# Patient Record
Sex: Male | Born: 1965 | Hispanic: No | Marital: Married | Smoking: Never smoker
Health system: Southern US, Community
[De-identification: ages and names within clinical notes are randomized; demographics above are authoritative.]

## PROBLEM LIST (undated history)

## (undated) DIAGNOSIS — R030 Elevated blood-pressure reading, without diagnosis of hypertension: Secondary | ICD-10-CM

## (undated) DIAGNOSIS — E669 Obesity, unspecified: Secondary | ICD-10-CM

## (undated) DIAGNOSIS — Z9989 Dependence on other enabling machines and devices: Secondary | ICD-10-CM

## (undated) DIAGNOSIS — Z8659 Personal history of other mental and behavioral disorders: Secondary | ICD-10-CM

## (undated) DIAGNOSIS — G4733 Obstructive sleep apnea (adult) (pediatric): Secondary | ICD-10-CM

## (undated) DIAGNOSIS — Z9109 Other allergy status, other than to drugs and biological substances: Secondary | ICD-10-CM

## (undated) DIAGNOSIS — R7303 Prediabetes: Secondary | ICD-10-CM

## (undated) DIAGNOSIS — L209 Atopic dermatitis, unspecified: Secondary | ICD-10-CM

## (undated) DIAGNOSIS — Z8639 Personal history of other endocrine, nutritional and metabolic disease: Secondary | ICD-10-CM

## (undated) DIAGNOSIS — K219 Gastro-esophageal reflux disease without esophagitis: Secondary | ICD-10-CM

## (undated) DIAGNOSIS — D126 Benign neoplasm of colon, unspecified: Secondary | ICD-10-CM

## (undated) DIAGNOSIS — G4730 Sleep apnea, unspecified: Secondary | ICD-10-CM

## (undated) DIAGNOSIS — A630 Anogenital (venereal) warts: Secondary | ICD-10-CM

## (undated) HISTORY — DX: Obesity, unspecified: E66.9

## (undated) HISTORY — DX: Dependence on other enabling machines and devices: Z99.89

## (undated) HISTORY — DX: Gastro-esophageal reflux disease without esophagitis: K21.9

## (undated) HISTORY — DX: Sleep apnea, unspecified: G47.30

## (undated) HISTORY — DX: Other allergy status, other than to drugs and biological substances: Z91.09

## (undated) HISTORY — DX: Personal history of other endocrine, nutritional and metabolic disease: Z86.39

## (undated) HISTORY — DX: Personal history of other mental and behavioral disorders: Z86.59

## (undated) HISTORY — DX: Obstructive sleep apnea (adult) (pediatric): G47.33

## (undated) HISTORY — PX: POLYPECTOMY: SHX149

## (undated) HISTORY — PX: COLONOSCOPY: SHX174

## (undated) HISTORY — DX: Anogenital (venereal) warts: A63.0

## (undated) HISTORY — DX: Prediabetes: R73.03

## (undated) HISTORY — DX: Elevated blood-pressure reading, without diagnosis of hypertension: R03.0

## (undated) HISTORY — DX: Benign neoplasm of colon, unspecified: D12.6

## (undated) HISTORY — DX: Atopic dermatitis, unspecified: L20.9

---
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
## 2008-01-04 ENCOUNTER — Other Ambulatory Visit (HOSPITAL_COMMUNITY): Admission: RE | Admit: 2008-01-04 | Discharge: 2008-02-01 | Payer: Self-pay | Admitting: Psychiatry

## 2008-01-06 ENCOUNTER — Ambulatory Visit: Payer: Self-pay | Admitting: Psychiatry

## 2009-04-07 DIAGNOSIS — Z8639 Personal history of other endocrine, nutritional and metabolic disease: Secondary | ICD-10-CM

## 2009-04-07 HISTORY — DX: Personal history of other endocrine, nutritional and metabolic disease: Z86.39

## 2011-01-07 LAB — URINE DRUGS OF ABUSE SCREEN W ALC, ROUTINE (REF LAB)
Amphetamine Screen, Ur: NEGATIVE
Cocaine Metabolites: NEGATIVE
Creatinine,U: 15.4
Methadone: NEGATIVE
Opiate Screen, Urine: NEGATIVE

## 2011-06-17 ENCOUNTER — Ambulatory Visit (INDEPENDENT_AMBULATORY_CARE_PROVIDER_SITE_OTHER): Payer: Managed Care, Other (non HMO) | Admitting: Family Medicine

## 2011-06-17 ENCOUNTER — Encounter: Payer: Self-pay | Admitting: Family Medicine

## 2011-06-17 VITALS — BP 116/82 | HR 77 | Temp 97.0°F | Ht 74.25 in | Wt 252.0 lb

## 2011-06-17 DIAGNOSIS — Z23 Encounter for immunization: Secondary | ICD-10-CM

## 2011-06-17 DIAGNOSIS — Z0000 Encounter for general adult medical examination without abnormal findings: Secondary | ICD-10-CM

## 2011-06-17 LAB — CBC WITH DIFFERENTIAL/PLATELET
Basophils Relative: 0.8 % (ref 0.0–3.0)
Eosinophils Relative: 5.3 % — ABNORMAL HIGH (ref 0.0–5.0)
Lymphocytes Relative: 30.3 % (ref 12.0–46.0)
MCV: 88.6 fl (ref 78.0–100.0)
Monocytes Relative: 10 % (ref 3.0–12.0)
Neutrophils Relative %: 53.6 % (ref 43.0–77.0)
RBC: 4.98 Mil/uL (ref 4.22–5.81)
WBC: 7.8 10*3/uL (ref 4.5–10.5)

## 2011-06-17 LAB — LIPID PANEL
Cholesterol: 203 mg/dL — ABNORMAL HIGH (ref 0–200)
HDL: 42.1 mg/dL (ref >39.00)
Total CHOL/HDL Ratio: 5
Triglycerides: 288 mg/dL — ABNORMAL HIGH (ref 0.0–149.0)
VLDL: 57.6 mg/dL — ABNORMAL HIGH (ref 0.0–40.0)

## 2011-06-17 LAB — COMPREHENSIVE METABOLIC PANEL
ALT: 27 U/L (ref 0–53)
AST: 19 U/L (ref 0–37)
Creatinine, Ser: 1.1 mg/dL (ref 0.4–1.5)
GFR: 75.49 mL/min (ref >60.00)
Total Bilirubin: 0.6 mg/dL (ref 0.3–1.2)

## 2011-06-17 NOTE — Patient Instructions (Signed)
Health Maintenance, Males A healthy lifestyle and preventative care can promote health and wellness.  Maintain regular health, dental, and eye exams.   Eat a healthy diet. Foods like vegetables, fruits, whole grains, low-fat dairy products, and lean protein foods contain the nutrients you need without too many calories. Decrease your intake of foods high in solid fats, added sugars, and salt. Get information about a proper diet from your caregiver, if necessary.   Regular physical exercise is one of the most important things you can do for your health. Most adults should get at least 150 minutes of moderate-intensity exercise (any activity that increases your heart rate and causes you to sweat) each week. In addition, most adults need muscle-strengthening exercises on 2 or more days a week.    Maintain a healthy weight. The body mass index (BMI) is a screening tool to identify possible weight problems. It provides an estimate of body fat based on height and weight. Your caregiver can help determine your BMI, and can help you achieve or maintain a healthy weight. For adults 20 years and older:   A BMI below 18.5 is considered underweight.   A BMI of 18.5 to 24.9 is normal.   A BMI of 25 to 29.9 is considered overweight.   A BMI of 30 and above is considered obese.   Maintain normal blood lipids and cholesterol by exercising and minimizing your intake of saturated fat. Eat a balanced diet with plenty of fruits and vegetables. Blood tests for lipids and cholesterol should begin at age 20 and be repeated every 5 years. If your lipid or cholesterol levels are high, you are over 50, or you are a high risk for heart disease, you may need your cholesterol levels checked more frequently.Ongoing high lipid and cholesterol levels should be treated with medicines, if diet and exercise are not effective.   If you smoke, find out from your caregiver how to quit. If you do not use tobacco, do not start.    If you choose to drink alcohol, do not exceed 2 drinks per day. One drink is considered to be 12 ounces (355 mL) of beer, 5 ounces (148 mL) of wine, or 1.5 ounces (44 mL) of liquor.   Avoid use of street drugs. Do not share needles with anyone. Ask for help if you need support or instructions about stopping the use of drugs.   High blood pressure causes heart disease and increases the risk of stroke. Blood pressure should be checked at least every 1 to 2 years. Ongoing high blood pressure should be treated with medicines if weight loss and exercise are not effective.   If you are 45 to 47 years old, ask your caregiver if you should take aspirin to prevent heart disease.   Diabetes screening involves taking a blood sample to check your fasting blood sugar level. This should be done once every 3 years, after age 45, if you are within normal weight and without risk factors for diabetes. Testing should be considered at a younger age or be carried out more frequently if you are overweight and have at least 1 risk factor for diabetes.   Colorectal cancer can be detected and often prevented. Most routine colorectal cancer screening begins at the age of 50 and continues through age 75. However, your caregiver may recommend screening at an earlier age if you have risk factors for colon cancer. On a yearly basis, your caregiver may provide home test kits to check for hidden   blood in the stool. Use of a small camera at the end of a tube, to directly examine the colon (sigmoidoscopy or colonoscopy), can detect the earliest forms of colorectal cancer. Talk to your caregiver about this at age 50, when routine screening begins. Direct examination of the colon should be repeated every 5 to 10 years through age 75, unless early forms of pre-cancerous polyps or small growths are found.   Hepatitis C blood testing is recommended for all people born from 1945 through 1965 and any individual with known risks for  hepatitis C.   Healthy men should no longer receive prostate-specific antigen (PSA) blood tests as part of routine cancer screening. Consult with your caregiver about prostate cancer screening.   Testicular cancer screening is not recommended for adolescents or adult males who have no symptoms. Screening includes self-exam, caregiver exam, and other screening tests. Consult with your caregiver about any symptoms you have or any concerns you have about testicular cancer.   Practice safe sex. Use condoms and avoid high-risk sexual practices to reduce the spread of sexually transmitted infections (STIs).   Use sunscreen with a sun protection factor (SPF) of 30 or greater. Apply sunscreen liberally and repeatedly throughout the day. You should seek shade when your shadow is shorter than you. Protect yourself by wearing long sleeves, pants, a wide-brimmed hat, and sunglasses year round, whenever you are outdoors.   Notify your caregiver of new moles or changes in moles, especially if there is a change in shape or color. Also notify your caregiver if a mole is larger than the size of a pencil eraser.   A one-time screening for abdominal aortic aneurysm (AAA) and surgical repair of large AAAs by sound wave imaging (ultrasonography) is recommended for ages 65 to 75 years who are current or former smokers.   Stay current with your immunizations.  Document Released: 09/20/2007 Document Revised: 03/13/2011 Document Reviewed: 08/19/2010 [employer] Patient Information 2012 [employer]. 

## 2011-06-17 NOTE — Assessment & Plan Note (Signed)
Reviewed age and gender appropriate health maintenance issues (prudent diet, regular exercise, health risks of tobacco and excessive alcohol, use of seatbelts, fire alarms in home, use of sunscreen).  Also reviewed age and gender appropriate health screening as well as vaccine recommendations. Tdap and flu vaccines today. Fasting CBC, CMET, TSH, and lipid panel today. Discussed getting started with some lifestyle modification for wt loss and CV health.

## 2011-06-17 NOTE — Progress Notes (Signed)
Office Note 06/18/2011  CC:  Chief Complaint  Patient presents with  . Establish Care    would like CPE    HPI:  William Guerrero is a 46 y.o. White male who is here to establish care and get CPE. Patient's most recent primary MD: ? Male MD in [place], he can't recall name (pt states no pertinent records there), last visit was about 2 yrs ago. Old records were not reviewed prior to or during today's visit.  No acute complaints.  Wife wanted him to get CPE with labs b/c it has been a few years since this has been done.  Past Medical History  Diagnosis Date  . OSA on CPAP dx'd 2013    Dr. Jerre Simon in [place]   . Elevated blood pressure reading without diagnosis of hypertension   . History of elevated glucose 2011  . 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 History of depression     situational; on meds briefly but stopped them and it spontaneously resolved.    Past Surgical History  Procedure Date  . Femur surgery 1984    Plate inserted for femur fx sustained playing football in HS; plate was later removed.  No residual problems.    Family History  Problem Relation Age of Onset  . Diabetes Father   . Cancer Maternal Grandfather     lung  . Heart disease Paternal Grandfather     sudden death age 60  . Hyperlipidemia Paternal Grandfather   . Hypertension Paternal Grandfather     History   Social History  . Marital Status: Married    Spouse Name: N/A    Number of Children: 2  . Years of Education: N/A   Occupational History  . Not on file.   Social History Main Topics  . Smoking status: Never Smoker   . Smokeless tobacco: Current User    Types: Chew   Comment: dip tobacco ocassionally, not daily  . Alcohol Use: Yes     rare  . Drug Use: No  . Sexually Active: Not on file   Other Topics Concern  . 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    Outpatient Encounter Prescriptions as of 06/17/2011  Medication Sig Dispense Refill  . Multiple Vitamin (MULTIVITAMIN) tablet Take 1 tablet by mouth daily.        No Known Allergies  ROS Review of Systems  Constitutional: Negative for fever, chills, appetite change and fatigue.  HENT: Negative for ear pain, congestion, sore throat, neck stiffness and dental problem.   Eyes: Negative for discharge, redness and visual disturbance.  Respiratory: Negative for cough, chest tightness, shortness of breath and wheezing.   Cardiovascular: Negative for chest pain, palpitations and leg swelling.  Gastrointestinal: Negative for nausea, vomiting, abdominal pain, diarrhea and blood in stool.  Genitourinary: Negative for dysuria, urgency, frequency, hematuria, flank pain and difficulty urinating.  Musculoskeletal: Negative for myalgias, back pain, joint swelling and arthralgias.  Skin: Negative for pallor and rash.  Neurological: Negative for dizziness, speech difficulty, weakness and headaches.  Hematological: Negative for adenopathy. Does not bruise/bleed easily.  Psychiatric/Behavioral: Negative for confusion and sleep disturbance. The patient is not nervous/anxious.     PE; Blood pressure 116/82, pulse 77, temperature 97 F (36.1 C), temperature source Temporal, height 6' 2.25" (1.886 m), weight  252 lb (114.306 kg), SpO2 98.00%. Gen: Alert, well appearing.  Patient is oriented to person, place, time, and situation. Psych: pleasant affect, lucid thought and speech. ENT: Ears: EACs clear, normal epithelium.  TMs with good light reflex and landmarks bilaterally.  Eyes: no injection, icteris, swelling, or exudate.  EOMI, PERRLA. Nose: no drainage or turbinate edema/swelling.  No injection or focal lesion.  Mouth: lips without lesion/swelling.  Oral mucosa pink and moist.  Dentition intact and without obvious caries or  gingival swelling.  Oropharynx without erythema, exudate, or swelling.  Neck - No masses or thyromegaly or limitation in range of motion RRR, without murmur, rub, or gallop. Carotid pulses easily palpable, symmetric pulsations, no bruit or palpable dilatation. Radial, femoral, and ankle pulses easily palpable and symmetric. No abdominal bruit. No varicosities.  Cap refill in extremities is brisk. Chest with symmetric expansion, good aeration, nonlabored respirations.  Clear and equal breath sounds in all lung fields.  No clubbing or cyanosis. ABD: soft, NT, ND, BS normal.  No hepatospenomegaly or mass.  No bruits. EXT: no clubbing, cyanosis, or edema.  Skin - no sores or suspicious lesions or rashes or color changes  Pertinent labs:  None today  ASSESSMENT AND PLAN:   New pt: obtain old records.  Health maintenance examination Reviewed age and gender appropriate health maintenance issues (prudent diet, regular exercise, health risks of tobacco and excessive alcohol, use of seatbelts, fire alarms in home, use of sunscreen).  Also reviewed age and gender appropriate health screening as well as vaccine recommendations. Tdap and flu vaccines today. Fasting CBC, CMET, TSH, and lipid panel today. Discussed getting started with some lifestyle modification for wt loss and CV health.     Return in about 1 year (around 06/16/2012) for annual CPE.

## 2011-06-18 ENCOUNTER — Telehealth: Payer: Self-pay | Admitting: Family Medicine

## 2011-06-18 ENCOUNTER — Encounter: Payer: Self-pay | Admitting: Family Medicine

## 2011-06-18 NOTE — Telephone Encounter (Signed)
A user error has taken place: encounter opened in error, closed for administrative reasons.

## 2011-06-23 ENCOUNTER — Encounter: Payer: Self-pay | Admitting: Family Medicine

## 2011-06-27 ENCOUNTER — Encounter: Payer: Self-pay | Admitting: Family Medicine

## 2012-06-16 ENCOUNTER — Encounter: Payer: Managed Care, Other (non HMO) | Admitting: Family Medicine

## 2012-07-15 ENCOUNTER — Encounter: Payer: Managed Care, Other (non HMO) | Admitting: Family Medicine

## 2012-07-19 ENCOUNTER — Encounter: Payer: Managed Care, Other (non HMO) | Admitting: Family Medicine

## 2012-07-22 ENCOUNTER — Encounter: Payer: Managed Care, Other (non HMO) | Admitting: Family Medicine

## 2012-07-27 ENCOUNTER — Encounter: Payer: Managed Care, Other (non HMO) | Admitting: Family Medicine

## 2012-07-28 ENCOUNTER — Ambulatory Visit (INDEPENDENT_AMBULATORY_CARE_PROVIDER_SITE_OTHER): Payer: Managed Care, Other (non HMO) | Admitting: Family Medicine

## 2012-07-28 ENCOUNTER — Encounter: Payer: Self-pay | Admitting: Family Medicine

## 2012-07-28 VITALS — BP 111/74 | HR 81 | Temp 98.0°F | Resp 16 | Ht 74.5 in | Wt 243.8 lb

## 2012-07-28 DIAGNOSIS — Z0000 Encounter for general adult medical examination without abnormal findings: Secondary | ICD-10-CM

## 2012-07-28 DIAGNOSIS — D225 Melanocytic nevi of trunk: Secondary | ICD-10-CM

## 2012-07-28 DIAGNOSIS — D235 Other benign neoplasm of skin of trunk: Secondary | ICD-10-CM

## 2012-07-28 NOTE — Progress Notes (Signed)
Office Note 07/28/2012  CC:  Chief Complaint  Patient presents with  . Annual Exam    HPI:  William Guerrero is a 47 y.o. White male who is here for CPE. Feeling well.  Working long hours but walks a lot at his job.  Three weeks ago he changed over to essentially a dairy-free+veggetarian diet and thinks he'll stick with this.   He says he feels well and is happy.   Past Medical History  Diagnosis Date  . OSA on CPAP dx'd 2013    5-12 cm H2O as of 06/26/11 (Dr. Jerre Simon in [place] )  . Elevated blood pressure reading without diagnosis of hypertension   . History of elevated glucose 2011  . Condyloma acuminata 1980s  . History of depression     situational; on meds briefly but stopped them and it spontaneously resolved.  . Atopic dermatitis   . House dust mite allergy     Past Surgical History  Procedure Laterality Date  . Femur surgery  1984    Plate inserted for femur fx sustained playing football in HS; plate was later removed.  No residual problems.    Family History  Problem Relation Age of Onset  . Diabetes Father   . Cancer Maternal Grandfather     lung  . Heart disease Paternal Grandfather     sudden death age 55  . Hyperlipidemia Paternal Grandfather   . Hypertension Paternal Grandfather     History   Social History  . Marital Status: Married    Spouse Name: N/A    Number of Children: 2  . Years of Education: N/A   Occupational History  . Not on file.   Social History Main Topics  . Smoking status: Never Smoker   . Smokeless tobacco: Current User    Types: Chew     Comment: dip tobacco ocassionally, not daily  . Alcohol Use: Yes     Comment: rare  . Drug Use: No  . Sexually Active: Not on file   Other Topics Concern  . Not on file   Social History Narrative   Married, 2 children.   Works in Retail buyer for a Programme researcher, broadcasting/film/video.   Education: 3 yrs college.   No T/A/Ds.   Enjoys spending time with his kids.   No  exercise currently but he is not a sedentary person.    Outpatient Prescriptions Prior to Visit  Medication Sig Dispense Refill  . Multiple Vitamin (MULTIVITAMIN) tablet Take 1 tablet by mouth daily.       No facility-administered medications prior to visit.    No Known Allergies  ROS Review of Systems  Constitutional: Negative for fever, chills, appetite change and fatigue.  HENT: Negative for ear pain, congestion, sore throat, neck stiffness and dental problem.   Eyes: Negative for discharge, redness and visual disturbance.  Respiratory: Negative for cough, chest tightness, shortness of breath and wheezing.   Cardiovascular: Negative for chest pain, palpitations and leg swelling.  Gastrointestinal: Negative for nausea, vomiting, abdominal pain, diarrhea and blood in stool.       Occ protrusion in umbillical region--easily reduceable.  Genitourinary: Negative for dysuria, urgency, frequency, hematuria, flank pain and difficulty urinating.  Musculoskeletal: Negative for myalgias, back pain, joint swelling and arthralgias.  Skin: Negative for pallor and rash.  Neurological: Negative for dizziness, speech difficulty, weakness and headaches.  Hematological: Negative for adenopathy. Does not bruise/bleed easily.  Psychiatric/Behavioral: Negative for confusion and sleep disturbance. The patient is  not nervous/anxious.     PE; Blood pressure 111/74, pulse 81, temperature 98 F (36.7 C), temperature source Oral, resp. rate 16, height 6' 2.5" (1.892 m), weight 243 lb 12 oz (110.564 kg), SpO2 97.00%. Gen: Alert, well appearing.  Patient is oriented to person, place, time, and situation. AFFECT: pleasant, lucid thought and speech. ENT:  Right EAC obstructed by soft/yellow cerumen, TM not visualized.  Left EAC with scant amount of cerumen, TM normal. Eyes: no injection, icteris, swelling, or exudate.  EOMI, PERRLA. Nose: no drainage or turbinate edema/swelling.  No injection or focal lesion.   Mouth: lips without lesion/swelling.  Oral mucosa pink and moist.  Dentition intact and without obvious caries or gingival swelling.  Oropharynx without erythema, exudate, or swelling.  Neck: supple/nontender.  No LAD, mass, or TM.  Carotid pulses 2+ bilaterally, without bruits. CV: RRR, no m/r/g.   LUNGS: CTA bilat, nonlabored resps, good aeration in all lung fields. ABD: soft, NT, ND, BS normal.  No hepatospenomegaly or mass.  No bruits. EXT: no clubbing, cyanosis, or edema.  Musculoskeletal: no joint swelling, erythema, warmth, or tenderness.  ROM of all joints intact. Skin - no sores or color changes.  Lots of lentigo and nevi, none that appear atypical. Genitals normal; both testes normal without tenderness, masses, hydroceles, varicoceles, erythema or swelling. Shaft normal, circumcised, meatus normal without discharge. No inguinal hernia noted. No inguinal lymphadenopathy.  Pertinent labs:  None today  ASSESSMENT AND PLAN:   Health maintenance examination Reviewed age and gender appropriate health maintenance issues (prudent diet, regular exercise, health risks of tobacco and excessive alcohol, use of seatbelts, fire alarms in home, use of sunscreen).  Also reviewed age and gender appropriate health screening as well as vaccine recommendations. No vaccines needed at this time. Will do CMET and FLP when he is fasting-his earliest convenience. Encouraged him to step up his exercise habits to try to match his recent dietary changes to he can continue some weight loss.  Dermatology referral for skin eval/surveillance since he has many moles and other pigmented lesions on trunk.  An After Visit Summary was printed and given to the patient.   FOLLOW UP:  Return for Return for fasting labs at pt's earliest convenience.  Next o/v for CPE (fasting) in 1 yr.

## 2012-07-28 NOTE — Assessment & Plan Note (Signed)
Reviewed age and gender appropriate health maintenance issues (prudent diet, regular exercise, health risks of tobacco and excessive alcohol, use of seatbelts, fire alarms in home, use of sunscreen).  Also reviewed age and gender appropriate health screening as well as vaccine recommendations. No vaccines needed at this time. Will do CMET and FLP when he is fasting-his earliest convenience. Encouraged him to step up his exercise habits to try to match his recent dietary changes to he can continue some weight loss.

## 2012-07-28 NOTE — Patient Instructions (Addendum)
Health Maintenance, Males A healthy lifestyle and preventative care can promote health and wellness.  Maintain regular health, dental, and eye exams.  Eat a healthy diet. Foods like vegetables, fruits, whole grains, low-fat dairy products, and lean protein foods contain the nutrients you need without too many calories. Decrease your intake of foods high in solid fats, added sugars, and salt. Get information about a proper diet from your caregiver, if necessary.  Regular physical exercise is one of the most important things you can do for your health. Most adults should get at least 150 minutes of moderate-intensity exercise (any activity that increases your heart rate and causes you to sweat) each week. In addition, most adults need muscle-strengthening exercises on 2 or more days a week.   Maintain a healthy weight. The body mass index (BMI) is a screening tool to identify possible weight problems. It provides an estimate of body fat based on height and weight. Your caregiver can help determine your BMI, and can help you achieve or maintain a healthy weight. For adults 20 years and older:  A BMI below 18.5 is considered underweight.  A BMI of 18.5 to 24.9 is normal.  A BMI of 25 to 29.9 is considered overweight.  A BMI of 30 and above is considered obese.  Maintain normal blood lipids and cholesterol by exercising and minimizing your intake of saturated fat. Eat a balanced diet with plenty of fruits and vegetables. Blood tests for lipids and cholesterol should begin at age 20 and be repeated every 5 years. If your lipid or cholesterol levels are high, you are over 50, or you are a high risk for heart disease, you may need your cholesterol levels checked more frequently.Ongoing high lipid and cholesterol levels should be treated with medicines, if diet and exercise are not effective.  If you smoke, find out from your caregiver how to quit. If you do not use tobacco, do not start.  If you  choose to drink alcohol, do not exceed 2 drinks per day. One drink is considered to be 12 ounces (355 mL) of beer, 5 ounces (148 mL) of wine, or 1.5 ounces (44 mL) of liquor.  Avoid use of street drugs. Do not share needles with anyone. Ask for help if you need support or instructions about stopping the use of drugs.  High blood pressure causes heart disease and increases the risk of stroke. Blood pressure should be checked at least every 1 to 2 years. Ongoing high blood pressure should be treated with medicines if weight loss and exercise are not effective.  If you are 45 to 48 years old, ask your caregiver if you should take aspirin to prevent heart disease.  Diabetes screening involves taking a blood sample to check your fasting blood sugar level. This should be done once every 3 years, after age 45, if you are within normal weight and without risk factors for diabetes. Testing should be considered at a younger age or be carried out more frequently if you are overweight and have at least 1 risk factor for diabetes.  Colorectal cancer can be detected and often prevented. Most routine colorectal cancer screening begins at the age of 50 and continues through age 75. However, your caregiver may recommend screening at an earlier age if you have risk factors for colon cancer. On a yearly basis, your caregiver may provide home test kits to check for hidden blood in the stool. Use of a small camera at the end of a tube,   to directly examine the colon (sigmoidoscopy or colonoscopy), can detect the earliest forms of colorectal cancer. Talk to your caregiver about this at age 50, when routine screening begins. Direct examination of the colon should be repeated every 5 to 10 years through age 75, unless early forms of pre-cancerous polyps or small growths are found.  Hepatitis C blood testing is recommended for all people born from 1945 through 1965 and any individual with known risks for hepatitis C.  Healthy  men should no longer receive prostate-specific antigen (PSA) blood tests as part of routine cancer screening. Consult with your caregiver about prostate cancer screening.  Testicular cancer screening is not recommended for adolescents or adult males who have no symptoms. Screening includes self-exam, caregiver exam, and other screening tests. Consult with your caregiver about any symptoms you have or any concerns you have about testicular cancer.  Practice safe sex. Use condoms and avoid high-risk sexual practices to reduce the spread of sexually transmitted infections (STIs).  Use sunscreen with a sun protection factor (SPF) of 30 or greater. Apply sunscreen liberally and repeatedly throughout the day. You should seek shade when your shadow is shorter than you. Protect yourself by wearing long sleeves, pants, a wide-brimmed hat, and sunglasses year round, whenever you are outdoors.  Notify your caregiver of new moles or changes in moles, especially if there is a change in shape or color. Also notify your caregiver if a mole is larger than the size of a pencil eraser.  A one-time screening for abdominal aortic aneurysm (AAA) and surgical repair of large AAAs by sound wave imaging (ultrasonography) is recommended for ages 65 to 75 years who are current or former smokers.  Stay current with your immunizations. Document Released: 09/20/2007 Document Revised: 06/16/2011 Document Reviewed: 08/19/2010 [employer] Patient Information 2013 [employer].  

## 2012-10-13 ENCOUNTER — Other Ambulatory Visit: Payer: Managed Care, Other (non HMO)

## 2012-10-14 ENCOUNTER — Other Ambulatory Visit (INDEPENDENT_AMBULATORY_CARE_PROVIDER_SITE_OTHER): Payer: Managed Care, Other (non HMO)

## 2012-10-14 DIAGNOSIS — Z0000 Encounter for general adult medical examination without abnormal findings: Secondary | ICD-10-CM

## 2012-10-14 LAB — COMPREHENSIVE METABOLIC PANEL
Alkaline Phosphatase: 69 U/L (ref 39–117)
Creatinine, Ser: 1 mg/dL (ref 0.4–1.5)
Glucose, Bld: 102 mg/dL — ABNORMAL HIGH (ref 70–99)
Sodium: 136 mEq/L (ref 135–145)
Total Bilirubin: 0.6 mg/dL (ref 0.3–1.2)
Total Protein: 6.8 g/dL (ref 6.0–8.3)

## 2012-10-14 LAB — LIPID PANEL
Cholesterol: 171 mg/dL (ref 0–200)
HDL: 40.8 mg/dL (ref >39.00)
Total CHOL/HDL Ratio: 4
Triglycerides: 204 mg/dL — ABNORMAL HIGH (ref 0.0–149.0)

## 2012-10-14 NOTE — Progress Notes (Signed)
Labs only

## 2012-10-15 LAB — LDL CHOLESTEROL, DIRECT: Direct LDL: 108.3 mg/dL

## 2014-04-18 ENCOUNTER — Encounter: Payer: Managed Care, Other (non HMO) | Admitting: Family Medicine

## 2014-04-25 ENCOUNTER — Encounter: Payer: Self-pay | Admitting: Family Medicine

## 2014-04-25 ENCOUNTER — Ambulatory Visit (INDEPENDENT_AMBULATORY_CARE_PROVIDER_SITE_OTHER): Payer: Managed Care, Other (non HMO) | Admitting: Family Medicine

## 2014-04-25 VITALS — BP 126/88 | HR 79 | Temp 97.8°F | Resp 18 | Ht 74.5 in | Wt 251.0 lb

## 2014-04-25 DIAGNOSIS — Z0000 Encounter for general adult medical examination without abnormal findings: Secondary | ICD-10-CM

## 2014-04-25 LAB — CBC WITH DIFFERENTIAL/PLATELET
Basophils Absolute: 0 10*3/uL (ref 0.0–0.1)
Basophils Relative: 0.5 % (ref 0.0–3.0)
EOS PCT: 6.7 % — AB (ref 0.0–5.0)
Eosinophils Absolute: 0.5 10*3/uL (ref 0.0–0.7)
HEMATOCRIT: 46.6 % (ref 39.0–52.0)
HEMOGLOBIN: 15.7 g/dL (ref 13.0–17.0)
LYMPHS ABS: 2.5 10*3/uL (ref 0.7–4.0)
Lymphocytes Relative: 32.4 % (ref 12.0–46.0)
MCHC: 33.6 g/dL (ref 30.0–36.0)
MCV: 86.9 fl (ref 78.0–100.0)
MONOS PCT: 11.8 % (ref 3.0–12.0)
Monocytes Absolute: 0.9 10*3/uL (ref 0.1–1.0)
NEUTROS ABS: 3.8 10*3/uL (ref 1.4–7.7)
Neutrophils Relative %: 48.6 % (ref 43.0–77.0)
PLATELETS: 314 10*3/uL (ref 150.0–400.0)
RBC: 5.36 Mil/uL (ref 4.22–5.81)
RDW: 13 % (ref 11.5–15.5)
WBC: 7.9 10*3/uL (ref 4.0–10.5)

## 2014-04-25 LAB — LIPID PANEL
Cholesterol: 172 mg/dL (ref 0–200)
HDL: 36.5 mg/dL — AB (ref >39.00)
NONHDL: 135.5
TRIGLYCERIDES: 205 mg/dL — AB (ref 0.0–149.0)
Total CHOL/HDL Ratio: 5
VLDL: 41 mg/dL — AB (ref 0.0–40.0)

## 2014-04-25 LAB — COMPREHENSIVE METABOLIC PANEL
ALK PHOS: 84 U/L (ref 39–117)
ALT: 19 U/L (ref 0–53)
AST: 16 U/L (ref 0–37)
Albumin: 3.9 g/dL (ref 3.5–5.2)
BILIRUBIN TOTAL: 0.8 mg/dL (ref 0.2–1.2)
BUN: 18 mg/dL (ref 6–23)
CO2: 30 mEq/L (ref 19–32)
Calcium: 9 mg/dL (ref 8.4–10.5)
Chloride: 105 mEq/L (ref 96–112)
Creatinine, Ser: 0.99 mg/dL (ref 0.40–1.50)
GFR: 85.11 mL/min (ref >60.00)
GLUCOSE: 112 mg/dL — AB (ref 70–99)
Potassium: 4.3 mEq/L (ref 3.5–5.1)
SODIUM: 138 meq/L (ref 135–145)
TOTAL PROTEIN: 6.7 g/dL (ref 6.0–8.3)

## 2014-04-25 LAB — LDL CHOLESTEROL, DIRECT: LDL DIRECT: 106 mg/dL

## 2014-04-25 LAB — TSH: TSH: 1.16 u[IU]/mL (ref 0.35–4.50)

## 2014-04-25 NOTE — Assessment & Plan Note (Signed)
Reviewed age and gender appropriate health maintenance issues (prudent diet, regular exercise, health risks of tobacco and excessive alcohol, use of seatbelts, fire alarms in home, use of sunscreen).  Also reviewed age and gender appropriate health screening as well as vaccine recommendations. UTD on vaccines. HP labs today (fasting). Encouraged TLC: he says he already has plans in place with the rest of his family to start this now.

## 2014-04-25 NOTE — Progress Notes (Signed)
Pre visit review using our clinic review tool, if applicable. No additional management support is needed unless otherwise documented below in the visit note. 

## 2014-04-25 NOTE — Progress Notes (Signed)
Office Note 04/25/2014  CC:  Chief Complaint  Patient presents with  . Annual Exam    fasting    HPI:  William Guerrero is a 49 y.o. White male who is here for annual CPE. Describes lifestyle of lots of hours of work, not eating healthy and not exercising. The family has plans on getting on a exercise regimen and bettery/healthy eating kick together.  Still compliant with CPAP. No outside bp's to report.  Past Medical History  Diagnosis Date  . OSA on CPAP dx'd 2013    5-12 cm H2O as of 06/26/11 (Dr. Michela Pitcher in [place] )  . Elevated blood pressure reading without diagnosis of hypertension   . History of elevated glucose 2011  . Condyloma acuminata 1980s  . History of depression     situational; on meds briefly but stopped them and it spontaneously resolved.  . Atopic dermatitis   . House dust mite allergy     Past Surgical History  Procedure Laterality Date  . Femur surgery  1984    Plate inserted for femur fx sustained playing football in HS; plate was later removed.  No residual problems.    Family History  Problem Relation Age of Onset  . Diabetes Father   . Cancer Maternal Grandfather     lung  . Heart disease Paternal Grandfather     sudden death age 70  . Hyperlipidemia Paternal Grandfather   . Hypertension Paternal Grandfather     History   Social History  . Marital Status: Married    Spouse Name: N/A    Number of Children: 2  . Years of Education: N/A   Occupational History  . Not on file.   Social History Main Topics  . Smoking status: Never Smoker   . Smokeless tobacco: Current User    Types: Chew     Comment: dip tobacco ocassionally, not daily  . Alcohol Use: Yes     Comment: rare  . Drug Use: No  . Sexual Activity: Not on file   Other Topics Concern  . Not on file   Social History Narrative   Married, 2 children.   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   Education: 3 yrs college.   No T/A/Ds.    Enjoys spending time with his kids.   No exercise currently but he is not a sedentary person.    Outpatient Prescriptions Prior to Visit  Medication Sig Dispense Refill  . Multiple Vitamin (MULTIVITAMIN) tablet Take 1 tablet by mouth daily.     No facility-administered medications prior to visit.    No Known Allergies  ROS Review of Systems  Constitutional: Negative for fever, chills, appetite change and fatigue.  HENT: Negative for congestion, dental problem, ear pain and sore throat.   Eyes: Positive for visual disturbance (occas brief periods of blurry vision that clear w/in 10 min, always after staring at CPU screen for long periods.). Negative for discharge and redness.  Respiratory: Negative for cough, chest tightness, shortness of breath and wheezing.   Cardiovascular: Negative for chest pain, palpitations and leg swelling.  Gastrointestinal: Negative for nausea, vomiting, abdominal pain, diarrhea and blood in stool.  Genitourinary: Negative for dysuria, urgency, frequency, hematuria, flank pain and difficulty urinating.  Musculoskeletal: Negative for myalgias, back pain, joint swelling, arthralgias and neck stiffness.  Skin: Negative for pallor and rash.  Neurological: Negative for dizziness, speech difficulty, weakness and headaches.  Hematological: Negative for adenopathy. Does not bruise/bleed easily.  Psychiatric/Behavioral: Negative for confusion and sleep disturbance. The patient is not nervous/anxious.     PE; Blood pressure 126/88, pulse 79, temperature 97.8 F (36.6 C), temperature source Temporal, resp. rate 18, height 6' 2.5" (1.892 m), weight 251 lb (113.853 kg), SpO2 97 %.  Gen: Alert, well appearing.  Patient is oriented to person, place, time, and situation. AFFECT: pleasant, lucid thought and speech. ENT: Ears: EACs clear, normal epithelium.  TMs with good light reflex and landmarks bilaterally.  Eyes: no injection, icteris, swelling, or exudate.  EOMI,  PERRLA. Nose: no drainage or turbinate edema/swelling.  No injection or focal lesion.  Mouth: lips without lesion/swelling.  Oral mucosa pink and moist.  Dentition intact and without obvious caries or gingival swelling.  Oropharynx without erythema, exudate, or swelling.  Neck: supple/nontender.  No LAD, mass, or TM.  Carotid pulses 2+ bilaterally, without bruits. CV: RRR, no m/r/g.   LUNGS: CTA bilat, nonlabored resps, good aeration in all lung fields. ABD: soft, NT, ND, BS normal.  No hepatospenomegaly or mass.  No bruits. EXT: no clubbing, cyanosis, or edema.  Musculoskeletal: no joint swelling, erythema, warmth, or tenderness.  ROM of all joints intact. Skin - no sores or suspicious lesions or rashes or color changes  Pertinent labs:  Lab Results  Component Value Date   TSH 1.41 06/17/2011   Lab Results  Component Value Date   WBC 7.8 06/17/2011   HGB 14.7 06/17/2011   HCT 44.1 06/17/2011   MCV 88.6 06/17/2011   PLT 288.0 06/17/2011   Lab Results  Component Value Date   CREATININE 1.0 10/14/2012   BUN 15 10/14/2012   NA 136 10/14/2012   K 3.8 10/14/2012   CL 104 10/14/2012   CO2 34* 10/14/2012   Lab Results  Component Value Date   ALT 23 10/14/2012   AST 17 10/14/2012   ALKPHOS 69 10/14/2012   BILITOT 0.6 10/14/2012   Lab Results  Component Value Date   CHOL 171 10/14/2012   Lab Results  Component Value Date   HDL 40.80 10/14/2012   No results found for: [facility] Lab Results  Component Value Date   TRIG 204.0* 10/14/2012   Lab Results  Component Value Date   CHOLHDL 4 10/14/2012   No results found for: PSA  ASSESSMENT AND PLAN:   Health maintenance examination Reviewed age and gender appropriate health maintenance issues (prudent diet, regular exercise, health risks of tobacco and excessive alcohol, use of seatbelts, fire alarms in home, use of sunscreen).  Also reviewed age and gender appropriate health screening as well as vaccine  recommendations. UTD on vaccines. HP labs today (fasting). Encouraged TLC: he says he already has plans in place with the rest of his family to start this now.   An After Visit Summary was printed and given to the patient.  FOLLOW UP:  Return in about 1 year (around 04/26/2015) for annual CPE (fasting).

## 2014-04-26 ENCOUNTER — Telehealth: Payer: Self-pay | Admitting: Family Medicine

## 2014-04-26 NOTE — Telephone Encounter (Signed)
emmi emailed °

## 2014-04-27 ENCOUNTER — Other Ambulatory Visit (INDEPENDENT_AMBULATORY_CARE_PROVIDER_SITE_OTHER): Payer: Managed Care, Other (non HMO)

## 2014-04-27 DIAGNOSIS — R7309 Other abnormal glucose: Secondary | ICD-10-CM

## 2014-04-27 DIAGNOSIS — R739 Hyperglycemia, unspecified: Secondary | ICD-10-CM

## 2014-04-27 LAB — HEMOGLOBIN A1C: Hgb A1c MFr Bld: 5.9 % (ref 4.6–6.5)

## 2015-01-23 DIAGNOSIS — D126 Benign neoplasm of colon, unspecified: Secondary | ICD-10-CM

## 2015-01-23 HISTORY — DX: Benign neoplasm of colon, unspecified: D12.6

## 2015-09-07 ENCOUNTER — Encounter (HOSPITAL_BASED_OUTPATIENT_CLINIC_OR_DEPARTMENT_OTHER): Payer: Self-pay | Admitting: *Deleted

## 2015-09-07 ENCOUNTER — Emergency Department (HOSPITAL_BASED_OUTPATIENT_CLINIC_OR_DEPARTMENT_OTHER)
Admission: EM | Admit: 2015-09-07 | Discharge: 2015-09-07 | Disposition: A | Discharge status: Home / Self Care | Payer: Managed Care, Other (non HMO) | Attending: Emergency Medicine | Admitting: Emergency Medicine

## 2015-09-07 DIAGNOSIS — Y939 Activity, unspecified: Secondary | ICD-10-CM | POA: Insufficient documentation

## 2015-09-07 DIAGNOSIS — T18128A Food in esophagus causing other injury, initial encounter: Secondary | ICD-10-CM | POA: Insufficient documentation

## 2015-09-07 DIAGNOSIS — T189XXA Foreign body of alimentary tract, part unspecified, initial encounter: Secondary | ICD-10-CM

## 2015-09-07 DIAGNOSIS — R1310 Dysphagia, unspecified: Secondary | ICD-10-CM

## 2015-09-07 DIAGNOSIS — X58XXXA Exposure to other specified factors, initial encounter: Secondary | ICD-10-CM | POA: Insufficient documentation

## 2015-09-07 DIAGNOSIS — Y929 Unspecified place or not applicable: Secondary | ICD-10-CM | POA: Insufficient documentation

## 2015-09-07 DIAGNOSIS — F329 Major depressive disorder, single episode, unspecified: Secondary | ICD-10-CM | POA: Diagnosis not present

## 2015-09-07 DIAGNOSIS — Y999 Unspecified external cause status: Secondary | ICD-10-CM | POA: Diagnosis not present

## 2015-09-07 NOTE — ED Notes (Signed)
Steak stuck in throat around 1230  Has diff swallowing since then  When arrived in tx room states felt like steak went on down  Is talking complete sentences,  Able to drink water without diff

## 2015-09-07 NOTE — ED Notes (Signed)
Piece of steak is lodged in his esophagus. Unable to swallow water. Hx of same. He is speaking in complete sentences.

## 2015-09-07 NOTE — ED Provider Notes (Signed)
CSN: [id number]     Arrival date & time 09/07/15  1829 History  By signing my name below, I, Georgette Shell, attest that this documentation has been prepared under the direction and in the presence of Merrily Pew, MD. Electronically Signed: Georgette Shell, ED Scribe. 09/07/2015. 7:57 PM.     Chief Complaint  Patient presents with  . Swallowed Foreign Body   The history is provided by the patient. No language interpreter was used.   HPI Comments: Jackhenry Jaroszewski. Ebanks is a 50 y.o. male who presents to the Emergency Department complaining of a sudden onset, constant, foreign body in his throat onset 8 hours ago. Patient was having a meal and had difficulty swallowing and felt like a piece of steak was stuck in his throat. Pt attempted to self induce vomiting to dislodge the steak without success. He notes that he had difficulty swallowing his own saliva and water due to the foreign body. He was seen at [facility] but was told to come to the ED instead for further evaluation. Patient states that while waiting to be seen in the ED he felt a sudden release of pressure in his throat and reports the steak is now gone. Patient has had similar episodes 5 times prior but does not feel like it is becoming more frequent. He attributes this episode to eating a large piece of meat too fast. Patient still complains of mild soreness to throat but is otherwise asymptomatic. Denies shortness of breath or any other associated symptoms.   Past Medical History  Diagnosis Date  . OSA on CPAP dx'd 2013    5-12 cm H2O as of 06/26/11 (Dr. Michela Pitcher in [place] )  . Elevated blood pressure reading without diagnosis of hypertension   . History of elevated glucose 2011  . Condyloma acuminata 1980s  . History of depression     situational; on meds briefly but stopped them and it spontaneously resolved.  . Atopic dermatitis   . House dust mite allergy    Past Surgical History  Procedure Laterality Date  . Femur surgery  1984    Plate  inserted for femur fx sustained playing football in HS; plate was later removed.  No residual problems.   Family History  Problem Relation Age of Onset  . Diabetes Father   . Cancer Maternal Grandfather     lung  . Heart disease Paternal Grandfather     sudden death age 68  . Hyperlipidemia Paternal Grandfather   . Hypertension Paternal Grandfather    Social History  Substance Use Topics  . Smoking status: Never Smoker   . Smokeless tobacco: Current User    Types: Chew     Comment: dip tobacco ocassionally, not daily  . Alcohol Use: Yes     Comment: rare    Review of Systems  Constitutional: Negative for fever.  HENT: Positive for sore throat and trouble swallowing.   Respiratory: Negative for shortness of breath.   All other systems reviewed and are negative.  Allergies  Review of patient's allergies indicates no known allergies.  Home Medications   Prior to Admission medications   Medication Sig Start Date End Date Taking? Authorizing Provider  Multiple Vitamin (MULTIVITAMIN) tablet Take 1 tablet by mouth daily.    Historical Provider, MD   BP 134/104 mmHg  Pulse 89  Temp(Src) 98.7 F (37.1 C) (Oral)  Resp 20  Ht 6\' 2"  (1.88 m)  Wt 243 lb (110.224 kg)  BMI 31.19 kg/m2  SpO2 97%  Physical Exam  Constitutional: He is oriented to person, place, and time. He appears well-developed and well-nourished.  HENT:  Head: Normocephalic and atraumatic.  Mouth/Throat: No posterior oropharyngeal edema.  Dental work done but no obvious infections. No asymmetric swelling near the tonsils.   Eyes: Conjunctivae are normal. Pupils are equal, round, and reactive to light.  Neck: Normal range of motion.  No pain in lateral and anterior neck.   Cardiovascular: Normal rate, regular rhythm and normal heart sounds.   Pulmonary/Chest: Effort normal and breath sounds normal. No respiratory distress.  Abdominal: He exhibits no distension.  Musculoskeletal: Normal range of motion.   Neurological: He is alert and oriented to person, place, and time.  Skin: Skin is warm and dry.  Psychiatric: He has a normal mood and affect. His behavior is normal.  Nursing note and vitals reviewed.   ED Course  Procedures (including critical care time) DIAGNOSTIC STUDIES: Oxygen Saturation is 97% on RA, normal by my interpretation.    COORDINATION OF CARE: 7:53 PM Discussed treatment plan which includes follow up with PCP with pt at bedside and pt agreed to plan.   MDM   Final diagnoses:  Dysphagia  Foreign body alimentary tract, initial encounter      Patient has esophageal impaction that slowly resolve until prior to my evaluation. On my evaluation no neck pain, evidence of abscess or other blockages in the back of his throat. Also tolerating by mouth and his saliva without difficult. This is happening multiple times however does not become more frequent recently thinks is because her allergies eating. If this becomes more frequent*7 per aggressively worsening difficulty swallowing her follow-up with his doctor for swallowing studies and GI referral. Otherwise no need for follow-up for this particular symptom.    New Prescriptions: Discharge Medication List as of 09/07/2015  7:57 PM       I have personally and contemperaneously reviewed labs and imaging and used in my decision making as above.   A medical screening exam was performed and I feel the patient has had an appropriate workup for their chief complaint at this time and likelihood of emergent condition existing is low and thus workup can continue on an outpatient basis.. Their vital signs are stable. They have been counseled on decision, discharge, follow up and which symptoms necessitate immediate return to the emergency department.  They verbally stated understanding and agreement with plan and discharged in stable condition.   I personally performed the services described in this documentation, which was scribed  in my presence. The recorded information has been reviewed and is accurate.   Merrily Pew, MD 09/07/15 [phone]

## 2016-05-22 ENCOUNTER — Emergency Department (HOSPITAL_BASED_OUTPATIENT_CLINIC_OR_DEPARTMENT_OTHER): Payer: Managed Care, Other (non HMO)

## 2016-05-22 ENCOUNTER — Emergency Department (HOSPITAL_BASED_OUTPATIENT_CLINIC_OR_DEPARTMENT_OTHER)
Admission: EM | Admit: 2016-05-22 | Discharge: 2016-05-22 | Disposition: A | Discharge status: Home / Self Care | Payer: Managed Care, Other (non HMO) | Attending: Dermatology | Admitting: Dermatology

## 2016-05-22 ENCOUNTER — Encounter (HOSPITAL_BASED_OUTPATIENT_CLINIC_OR_DEPARTMENT_OTHER): Payer: Self-pay

## 2016-05-22 DIAGNOSIS — Y929 Unspecified place or not applicable: Secondary | ICD-10-CM | POA: Insufficient documentation

## 2016-05-22 DIAGNOSIS — Z5321 Procedure and treatment not carried out due to patient leaving prior to being seen by health care provider: Secondary | ICD-10-CM | POA: Diagnosis not present

## 2016-05-22 DIAGNOSIS — T18108A Unspecified foreign body in esophagus causing other injury, initial encounter: Secondary | ICD-10-CM | POA: Insufficient documentation

## 2016-05-22 DIAGNOSIS — Y939 Activity, unspecified: Secondary | ICD-10-CM | POA: Insufficient documentation

## 2016-05-22 DIAGNOSIS — X58XXXA Exposure to other specified factors, initial encounter: Secondary | ICD-10-CM | POA: Insufficient documentation

## 2016-05-22 DIAGNOSIS — F17220 Nicotine dependence, chewing tobacco, uncomplicated: Secondary | ICD-10-CM | POA: Diagnosis not present

## 2016-05-22 DIAGNOSIS — Y999 Unspecified external cause status: Secondary | ICD-10-CM | POA: Insufficient documentation

## 2016-05-22 NOTE — ED Triage Notes (Addendum)
Pt c/o chicken stuck in esophagus since 2p-hx of same approx 6 mos ago-pt states he is unable to swallow water-is handling own secretions

## 2016-05-22 NOTE — ED Notes (Signed)
Per registation pt is leaving,  states feels like food bolus has moved and will follow up w pcp

## 2016-05-23 ENCOUNTER — Encounter: Payer: Self-pay | Admitting: Physician Assistant

## 2016-05-28 ENCOUNTER — Ambulatory Visit: Payer: Managed Care, Other (non HMO) | Admitting: Physician Assistant

## 2016-05-28 ENCOUNTER — Telehealth: Payer: Self-pay | Admitting: Physician Assistant

## 2016-05-29 NOTE — Telephone Encounter (Signed)
No late cancel fee- sorry he is sick

## 2016-06-18 ENCOUNTER — Ambulatory Visit (INDEPENDENT_AMBULATORY_CARE_PROVIDER_SITE_OTHER): Payer: Managed Care, Other (non HMO) | Admitting: Physician Assistant

## 2016-06-18 ENCOUNTER — Encounter: Payer: Self-pay | Admitting: Physician Assistant

## 2016-06-18 VITALS — BP 124/74 | HR 88 | Ht 74.0 in | Wt 258.1 lb

## 2016-06-18 DIAGNOSIS — Z86010 Personal history of colonic polyps: Secondary | ICD-10-CM | POA: Diagnosis not present

## 2016-06-18 DIAGNOSIS — R1319 Other dysphagia: Secondary | ICD-10-CM | POA: Diagnosis not present

## 2016-06-18 NOTE — Progress Notes (Signed)
Subjective:    Patient ID: William Guerrero, male    DOB: 08/24/1965, 51 y.o.   MRN: [id number]  HPI William Guerrero is a pleasant 51 year old white male, new to GI today referred by the [facility]  [place] for evaluation of recurrent episodes of solid food dysphagia. Patient has history of sleep apnea, hypertension and obesity. He says over the past year he has probably had 10 episodes of having meat become lodged in his esophagus. He says with most of these episodes he has been able to regurgitate it back up, however he had an ER visit in June 2017 and again in February 2018 with transient food impactions. He says with this last visit he sat in the ER for 5 hours prior to being seen and the food eventually went on down prior to being evaluated. He says a  similar situation happened in June but he was advised to have GI evaluation. He says he has always been a fast eater and probably doesn't chew his food very well says these episodes always occur with the first or second bite and always with meat. . In between these episodes she is not having any milder symptoms of dysphagia though he does admit to frequent belching he has no regular complaints of heartburn or indigestion. No current complaints of abdominal pain or problems with his bowels. He did have a screening colonoscopy done  Per a  Dr Saralyn Pilar with [facility] in October 2016. He was found to have 1 tubular adenomatous polyp.  Review of Systems Pertinent positive and negative review of systems were noted in the above HPI section.  All other review of systems was otherwise negative.  Outpatient Encounter Prescriptions as of 06/18/2016  Medication Sig  . Multiple Vitamin (MULTIVITAMIN) tablet Take 1 tablet by mouth daily.  . Probiotic Product (PROBIOTIC PO) Take by mouth every other day.   No facility-administered encounter medications on file as of 06/18/2016.    No Known Allergies Patient Active Problem List   Diagnosis Date Noted  .  Health maintenance examination 06/17/2011   Social History   Social History  . Marital status: Married    Spouse name: N/A  . Number of children: 2  . Years of education: N/A   Occupational History  . car salesman    Social History Main Topics  . Smoking status: Never Smoker  . Smokeless tobacco: Current User    Types: Chew, Snuff  . Alcohol use 1.8 - 3.0 oz/week    3 - 5 Cans of beer per week  . Drug use: No  . Sexual activity: Not on file   Other Topics Concern  . Not on file   Social History Narrative   Married, 2 children.   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   Education: 3 yrs college.   No T/A/Ds.   Enjoys spending time with his kids.   No exercise currently but he is not a sedentary person.    Mr. Opdahl family history includes Cancer in his maternal grandfather and paternal grandmother; Dementia in his mother; Diabetes in his father; Heart disease in his paternal grandfather; Hyperlipidemia in his paternal grandfather; Hypertension in his paternal grandfather; Stomach cancer in his father; Ulcerative colitis in his sister.      Objective:    Vitals:   06/18/16 1309  BP: 124/74  Pulse: 88    Physical Exam   well-developed white male in no acute distress, pleasant blood pressure  124/74 pulse 88, height 6 foot 2, weight 258, BMI 33.1. HEENT; nontraumatic normocephalic EOMI PERRLA sclera anicteric, Cardiovascular; regular rate and rhythm with S1-S2 no murmur or gallop, Pulmonary; clear bilaterally, Abdomen ;obese soft nontender nondistended bowel sounds are active there is no palpable mass or hepatosplenomegaly, Rectal; exam not done, Extremities; no clubbing cyanosis or edema skin warm dry, Neuropsych; mood and affect appropriate       Assessment & Plan:   #60  51 year old white male with 1 year history of intermittent solid food dysphagia to meat with discrete episodes of what sounds like transient food impactions usually  alleviated with regurgitation. He has had 2 ER visits but with both episodes symptoms resolved without any intervention. Rule out esophageal stricture, rule out esophageal ring  #2 history of adenomatous colon polyps, last colonoscopy October 2016 done through [facility]. Will need follow-up colonoscopy October 2021 #3 hypertension #4 sleep apnea  Plan; Options discussed with the patient including barium swallow versus proceeding to EGD with possible esophageal dilation. He would like to proceed with EGD, with possible dilation. Procedure will be scheduled with Dr. Havery Moros. Procedure discussed in detail with the patient including risks and benefits and he is agreeable to proceed Further plans pending results of EGD. Will place patient in  system for recall colonoscopy with Dr. Havery Moros October 2021.  Emsley Custer S Chandra Asher PA-C 06/18/2016   Cc: Larene Beach, MD

## 2016-06-18 NOTE — Patient Instructions (Signed)
If you are age 52 or older, your body mass index should be between 23-30. Your Body mass index is 33.14 kg/m. If this is out of the aforementioned range listed, please consider follow up with your Primary Care Provider.  If you are age 38 or younger, your body mass index should be between 19-25. Your Body mass index is 33.14 kg/m. If this is out of the aformentioned range listed, please consider follow up with your Primary Care Provider.   You have been scheduled for an endoscopy. Please follow written instructions given to you at your visit today. If you use inhalers (even only as needed), please bring them with you on the day of your procedure. Your physician has requested that you go to www.startemmi.com and enter the access code given to you at your visit today. This web site gives a general overview about your procedure. However, you should still follow specific instructions given to you by our office regarding your preparation for the procedure.  Today we are providing you with information regarding Esophagitis and Stricture and Esophageal Dilatation.  We are putting you in the system for a colon recall for 01/2020.    I appreciate the opportunity to care for you.

## 2016-06-18 NOTE — Progress Notes (Signed)
Agree with assessment and plan as outlined.  

## 2016-07-09 ENCOUNTER — Encounter: Payer: Self-pay | Admitting: Gastroenterology

## 2016-07-23 ENCOUNTER — Ambulatory Visit (AMBULATORY_SURGERY_CENTER): Payer: Managed Care, Other (non HMO) | Admitting: Gastroenterology

## 2016-07-23 ENCOUNTER — Encounter: Payer: Self-pay | Admitting: Gastroenterology

## 2016-07-23 VITALS — BP 116/68 | HR 71 | Temp 99.3°F | Resp 19 | Ht 74.0 in | Wt 258.0 lb

## 2016-07-23 DIAGNOSIS — K209 Esophagitis, unspecified: Secondary | ICD-10-CM | POA: Diagnosis not present

## 2016-07-23 DIAGNOSIS — K229 Disease of esophagus, unspecified: Secondary | ICD-10-CM | POA: Diagnosis not present

## 2016-07-23 DIAGNOSIS — K2980 Duodenitis without bleeding: Secondary | ICD-10-CM | POA: Diagnosis not present

## 2016-07-23 DIAGNOSIS — R1310 Dysphagia, unspecified: Secondary | ICD-10-CM

## 2016-07-23 HISTORY — PX: UPPER GASTROINTESTINAL ENDOSCOPY: SHX188

## 2016-07-23 MED ORDER — SODIUM CHLORIDE 0.9 % IV SOLN: 500.0000 mL | INTRAVENOUS | Status: DC

## 2016-07-23 MED ORDER — OMEPRAZOLE 40 MG PO CPDR: 40.0000 mg | DELAYED_RELEASE_CAPSULE | Freq: Every day | ORAL | 3 refills | Status: DC

## 2016-07-23 NOTE — Patient Instructions (Signed)
Discharge instructions given. Handout on a dilatation diet. Resume previous medications. YOU HAD AN ENDOSCOPIC PROCEDURE TODAY AT THE [facility]:   Refer to the procedure report that was given to you for any specific questions about what was found during the examination.  If the procedure report does not answer your questions, please call your gastroenterologist to clarify.  If you requested that your care partner not be given the details of your procedure findings, then the procedure report has been included in a sealed envelope for you to review at your convenience later.  YOU SHOULD EXPECT: Some feelings of bloating in the abdomen. Passage of more gas than usual.  Walking can help get rid of the air that was put into your GI tract during the procedure and reduce the bloating. If you had a lower endoscopy (such as a colonoscopy or flexible sigmoidoscopy) you may notice spotting of blood in your stool or on the toilet paper. If you underwent a bowel prep for your procedure, you may not have a normal bowel movement for a few days.  Please Note:  You might notice some irritation and congestion in your nose or some drainage.  This is from the oxygen used during your procedure.  There is no need for concern and it should clear up in a day or so.  SYMPTOMS TO REPORT IMMEDIATELY:    Following upper endoscopy (EGD)  Vomiting of blood or coffee ground material  New chest pain or pain under the shoulder blades  Painful or persistently difficult swallowing  New shortness of breath  Fever of 100F or higher  Black, tarry-looking stools  For urgent or emergent issues, a gastroenterologist can be reached at any hour by calling [phone].   DIET:  We do recommend a small meal at first, but then you may proceed to your regular diet.  Drink plenty of fluids but you should avoid alcoholic beverages for 24 hours.  ACTIVITY:  You should plan to take it easy for the rest of today and you  should NOT DRIVE or use heavy machinery until tomorrow (because of the sedation medicines used during the test).    FOLLOW UP: Our staff will call the number listed on your records the next business day following your procedure to check on you and address any questions or concerns that you may have regarding the information given to you following your procedure. If we do not reach you, we will leave a message.  However, if you are feeling well and you are not experiencing any problems, there is no need to return our call.  We will assume that you have returned to your regular daily activities without incident.  If any biopsies were taken you will be contacted by phone or by letter within the next 1-3 weeks.  Please call us at [phone] if you have not heard about the biopsies in 3 weeks.    SIGNATURES/CONFIDENTIALITY: You and/or your care partner have signed paperwork which will be entered into your electronic medical record.  These signatures attest to the fact that that the information above on your After Visit Summary has been reviewed and is understood.  Full responsibility of the confidentiality of this discharge information lies with you and/or your care-partner. 

## 2016-07-23 NOTE — Op Note (Signed)
[place] Patient Name: William Guerrero Procedure Date: 07/23/2016 9:57 AM MRN: [id number] Endoscopist: Remo Lipps P. Verbie [name] MD, MD Age: 51 Referring MD:  Date of Birth: 1965-09-12 Gender: Male Account #: [id number] Procedure:                Upper GI endoscopy Indications:              Dysphagia with history of near impactions Medicines:                Monitored Anesthesia Care Procedure:                Pre-Anesthesia Assessment:                           - Prior to the procedure, a History and Physical                            was performed, and patient medications and                            allergies were reviewed. The patient's tolerance of                            previous anesthesia was also reviewed. The risks                            and benefits of the procedure and the sedation                            options and risks were discussed with the patient.                            All questions were answered, and informed consent                            was obtained. Prior Anticoagulants: The patient has                            taken no previous anticoagulant or antiplatelet                            agents. ASA Grade Assessment: II - A patient with                            mild systemic disease. After reviewing the risks                            and benefits, the patient was deemed in                            satisfactory condition to undergo the procedure.                           After obtaining informed consent, the endoscope was  passed under direct vision. Throughout the                            procedure, the patient's blood pressure, pulse, and                            oxygen saturations were monitored continuously. The                            Endoscope was introduced through the mouth, and                            advanced to the second part of duodenum. The upper                            GI  endoscopy was accomplished without difficulty.                            The patient tolerated the procedure well. Scope In: Scope Out: Findings:                 Esophagogastric landmarks were identified: the                            Z-line was found at 42 cm, the gastroesophageal                            junction was found at 42 cm and the upper extent of                            the gastric folds was found at 42 cm from the                            incisors.                           Mucosal changes including longitudinal furrows and                            white plaques were found in the entire esophagus,                            suggestive of eosinophilic esophagitis. Biopsies                            were obtained from the proximal and distal                            esophagus with cold forceps for histology of                            suspected eosinophilic esophagitis. A TTS dilator  was passed through the scope. Dilation with a                            16-17-18 mm balloon dilator was performed to 18 mm.                            The balloon was inflated at the GEJ and dragged up                            the esophagus proximally, no obvious strictures                            were appreciated.                           The exam of the esophagus was otherwise normal.                           The entire examined stomach was normal.                           Patchy mildly erythematous mucosa was found in the                            duodenal bulb. Biopsies were taken with a cold                            forceps for histology.                           The exam of the duodenum was otherwise normal. Complications:            No immediate complications. Estimated blood loss:                            Minimal. Estimated Blood Loss:     Estimated blood loss was minimal. Impression:               - Esophagogastric landmarks  identified.                           - Esophageal mucosal changes suggestive of                            eosinophilic esophagitis which I suspect is the                            cause of the patient's symptoms. Biopsied. Dilated                            to 22mm balloon and dragge up the esophagus                            proximally, no obvious strictures appreciated.                           -  Normal stomach.                           - Erythematous duodenopathy. Biopsied. Recommendation:           - Patient has a contact number available for                            emergencies. The signs and symptoms of potential                            delayed complications were discussed with the                            patient. Return to normal activities tomorrow.                            Written discharge instructions were provided to the                            patient.                           - Resume previous diet.                           - Continue present medications.                           - Start omeprazole 40mg  once daily                           - Await pathology results.                           - If biopsies consistent with eosinophilic                            esophagitis, may repeat endoscopy in 2 months while                            on omeprazole to confirm the diagnosis Remo Lipps P. [name] MD, MD 07/23/2016 10:24:26 AM This report has been signed electronically.

## 2016-07-23 NOTE — Progress Notes (Signed)
Called to room to assist during endoscopic procedure.  Patient ID and intended procedure confirmed with present staff. Received instructions for my participation in the procedure from the performing physician.  

## 2016-07-23 NOTE — Progress Notes (Signed)
A/ox3 pleased with MAC, report to Cecilia, RN 

## 2016-07-24 ENCOUNTER — Telehealth: Payer: Self-pay | Admitting: *Deleted

## 2016-07-24 NOTE — Telephone Encounter (Signed)
  Follow up Call-  Call back number 07/23/2016  Post procedure Call Back phone  # [phone]  Permission to leave phone message Yes  Some recent data might be hidden    [facility]

## 2016-07-24 NOTE — Telephone Encounter (Signed)
  Follow up Call-  Call back number 07/23/2016  Post procedure Call Back phone  # [phone]  Permission to leave phone message Yes  Some recent data might be hidden     Patient questions:  Do you have a fever, pain , or abdominal swelling? No. Pain Score  0 *  Have you tolerated food without any problems? Yes.    Have you been able to return to your normal activities? Yes.    Do you have any questions about your discharge instructions: Diet   No. Medications  No. Follow up visit  No.  Do you have questions or concerns about your Care? No.  Actions: * If pain score is 4 or above: No action needed, pain <4.

## 2016-07-31 ENCOUNTER — Telehealth: Payer: Self-pay | Admitting: Gastroenterology

## 2016-07-31 NOTE — Telephone Encounter (Signed)
Patient advised of results. He is doing okay after the dilation, he is very conscious to take smaller bites and chew his food better. He will continue to take the omeprazole for two months to help with the inflammation. He understands to contact office if sxs return to 2 months and will schedule follow up visit.

## 2016-07-31 NOTE — Telephone Encounter (Signed)
Almyra Free can you please relay path results to this patient. I just got notified his pathology results were sent to the wrong provider, I was just notified of this now.  He had an EGD done for dysphagia. I thought he had inflammatory changes in his esophagus concerning for eosinophilic esophagitis. Biopsies however, while showing chronic inflammatory changes, are not consistent with eosinophilic esophagitis. He could have had this appearance just due to reflux. He also had mild peptic duodenitis noted the exam, biopsies showing only inflammatory changes. He had no stenosis of his esophagus noted on this exam, but I performed a dilation to 37mm.  Not sure if he has had any benefit from the dilation at this point. I otherwise recommended a trial of omeprazole 40mg  once daily for at least 2 months or so and see how he does. We had discussed repeating an EGD on this regimenin 8 weeks or so if biopsies were consistent with eosinophilic esophagitis to confirm the diagnosis, but biopsies are not consistent with this. If PPI and dilation has resolved his symptoms he does not need further EGD and can follow up as needed. If in 2 months his symptoms persist please have him call back and I will see him in the clinic for further evaluation and discuss performing esophageal manometry. Thanks

## 2017-08-16 ENCOUNTER — Other Ambulatory Visit: Payer: Self-pay | Admitting: Gastroenterology

## 2018-03-16 IMAGING — DX DG NECK SOFT TISSUE
2 series · 2 of 2 positions shown · non-contrast
Comparison: None.

CLINICAL DATA: Food bolus in throat.

EXAM:
NECK SOFT TISSUES - 1+ VIEW

[neck lat]
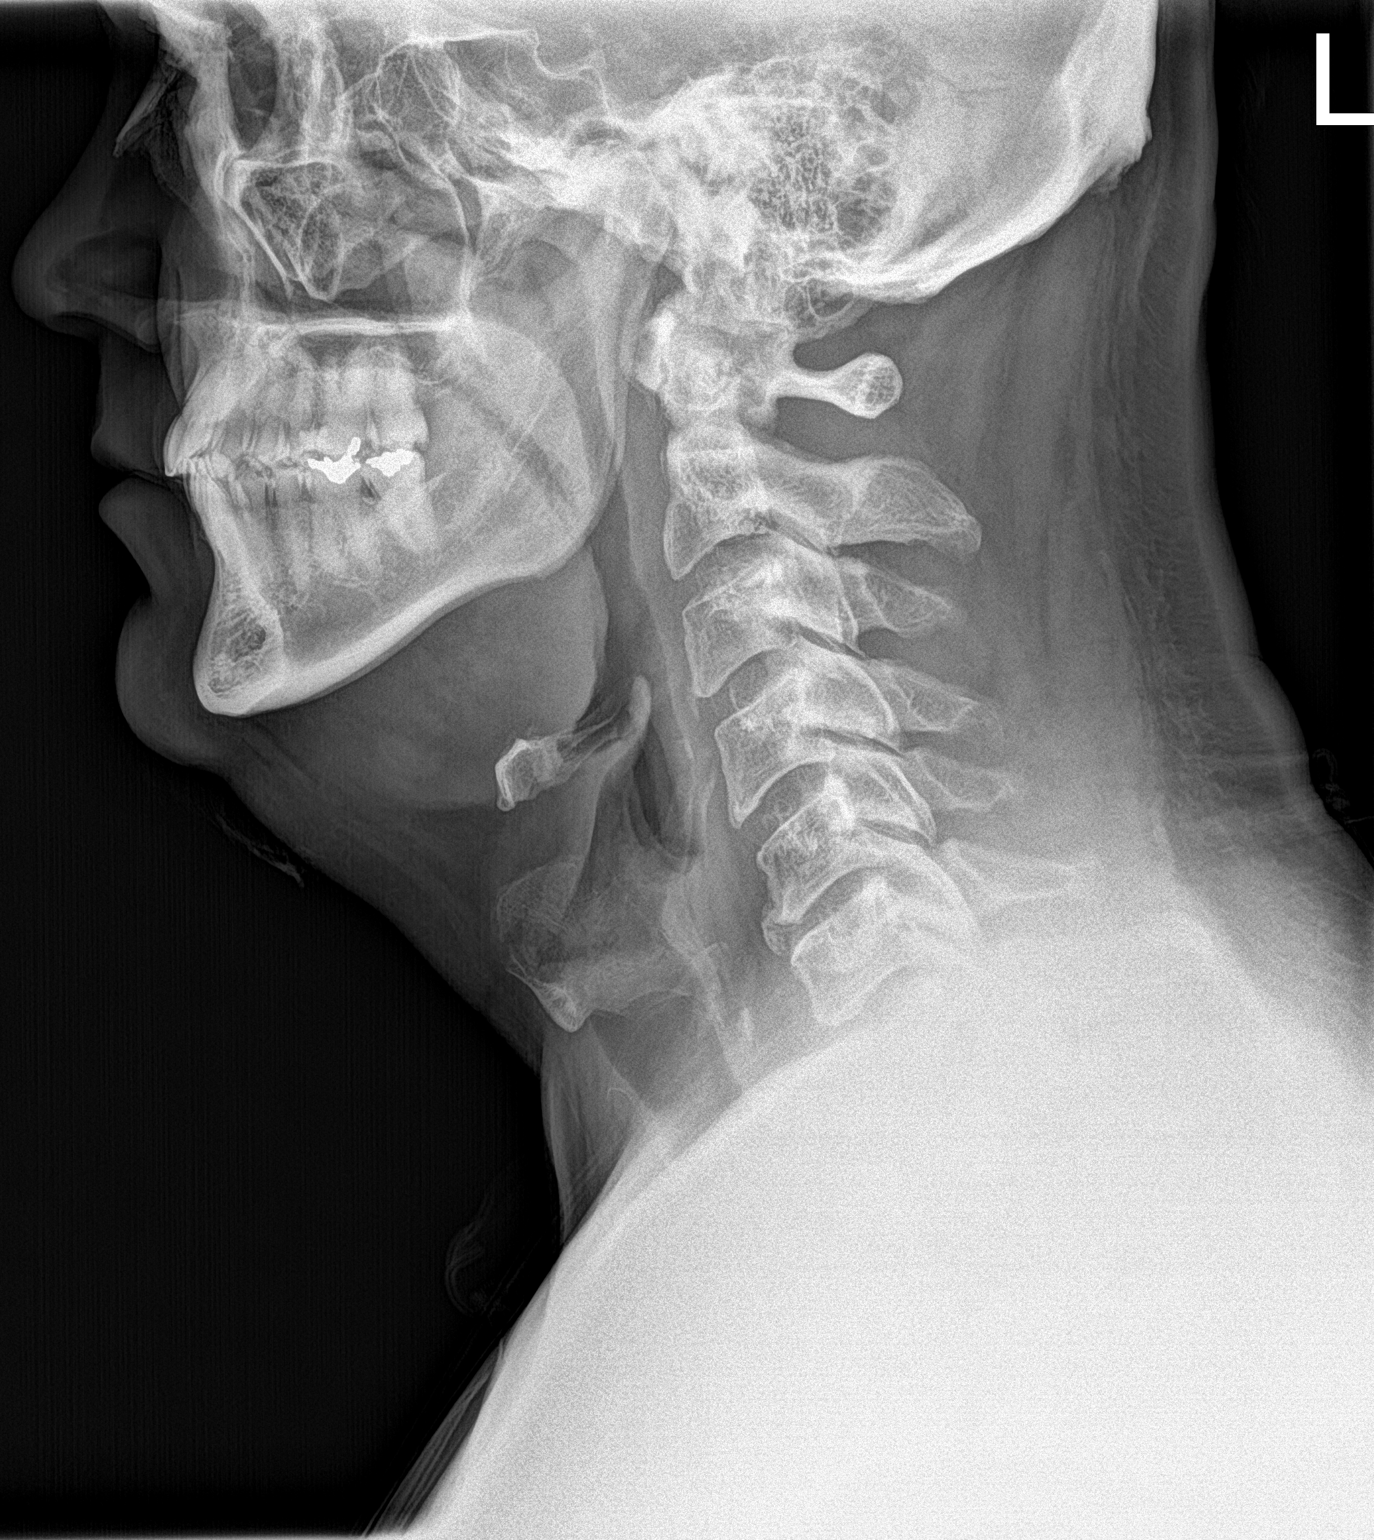

[neck ap]
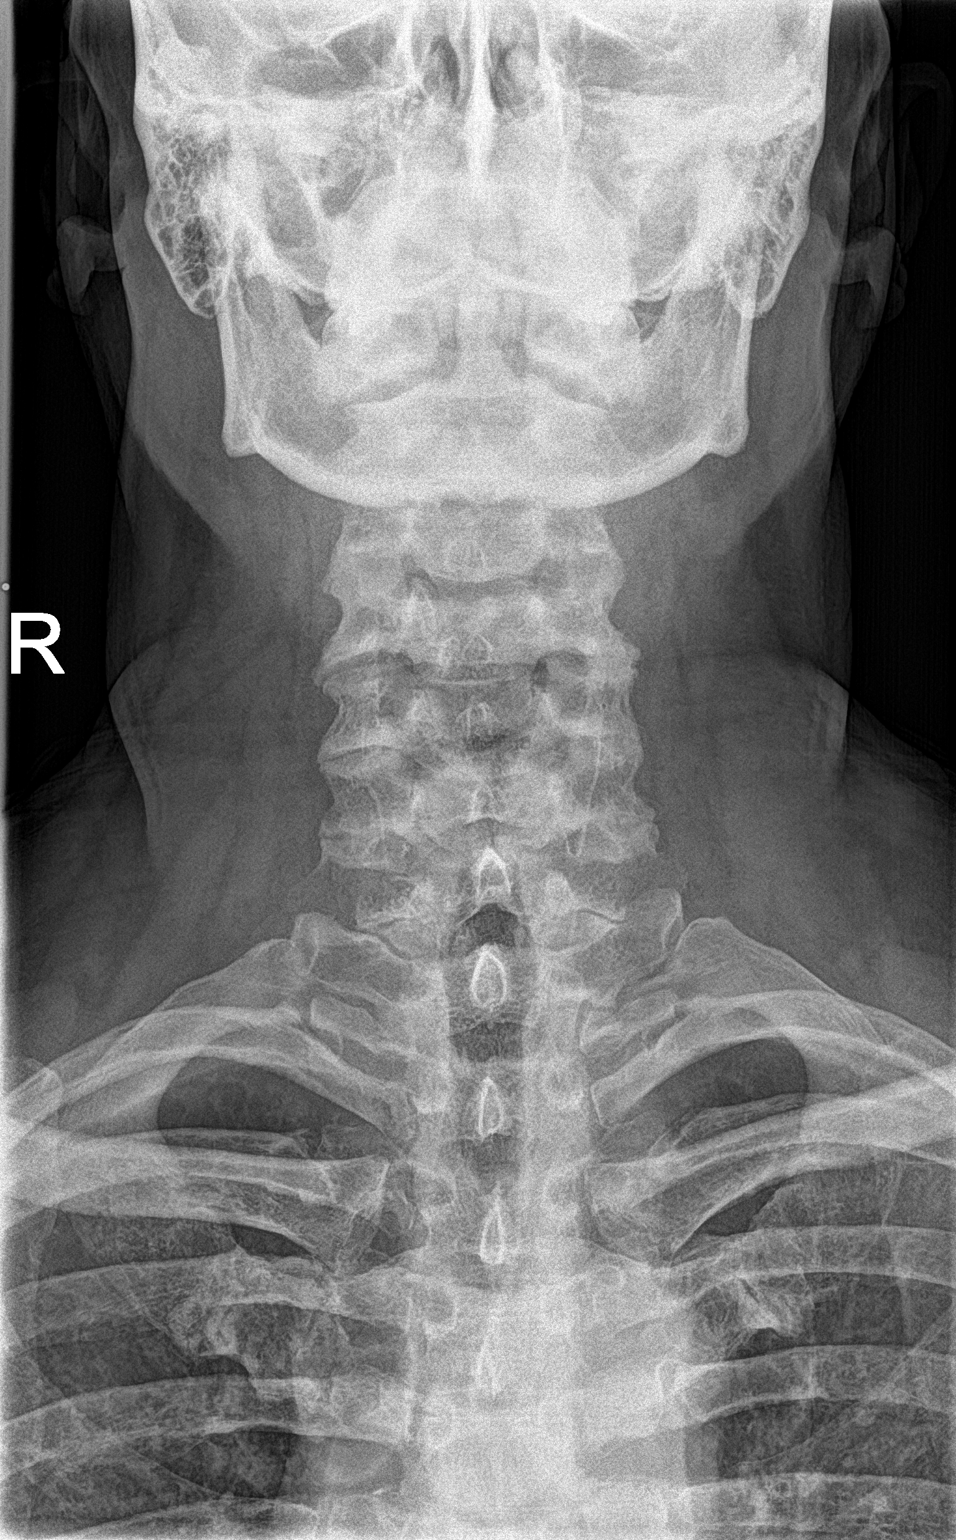

[2 of 2 positions shown; findings below may reference images not displayed]

FINDINGS: There is no evidence of retropharyngeal soft tissue swelling or
epiglottic enlargement. The cervical airway is unremarkable and no
radio-opaque foreign body identified. Slight disc space narrowing
C5-6 with anterior osteophytes.
IMPRESSION: No foreign body identified. No prevertebral soft tissue swelling is
noted. Patent airway.

## 2018-09-07 ENCOUNTER — Other Ambulatory Visit: Payer: Self-pay | Admitting: Gastroenterology

## 2018-09-07 NOTE — Telephone Encounter (Signed)
Pt requested refill on omeprazole--scheduled for Doximity visit with Dr. Havery Moros 6/26.

## 2018-09-30 NOTE — Progress Notes (Signed)
Prescreened pt for virtual appt tomorrow

## 2018-10-01 ENCOUNTER — Other Ambulatory Visit: Payer: Self-pay | Admitting: Gastroenterology

## 2018-10-01 ENCOUNTER — Ambulatory Visit (INDEPENDENT_AMBULATORY_CARE_PROVIDER_SITE_OTHER): Payer: Managed Care, Other (non HMO) | Admitting: Gastroenterology

## 2018-10-01 VITALS — Ht 74.0 in | Wt 250.0 lb

## 2018-10-01 DIAGNOSIS — R1310 Dysphagia, unspecified: Secondary | ICD-10-CM

## 2018-10-01 DIAGNOSIS — K219 Gastro-esophageal reflux disease without esophagitis: Secondary | ICD-10-CM

## 2018-10-01 MED ORDER — OMEPRAZOLE 40 MG PO CPDR: DELAYED_RELEASE_CAPSULE | ORAL | 3 refills | Status: DC

## 2018-10-01 NOTE — Progress Notes (Signed)
THIS ENCOUNTER IS A VIRTUAL VISIT DUE TO COVID-19 - PATIENT WAS NOT SEEN IN THE OFFICE. PATIENT HAS CONSENTED TO VIRTUAL VISIT / TELEMEDICINE VISIT VIA DOXIMITY APP    Location of patient: home Location of provider: office Persons participating: myself, patient  HPI :  53 y/o male here for a follow up visit.  I last saw him in April 2018 for dysphagia, episodes of near impaction. He had also complained of belching and periodic reflux symptoms. We performed an endoscopy in April 2019 for him. He had some inflammatory changes in the esophagus at the time which was concerning for EoE, and empiric dilation was performed to 1mm with a balloon and dragged proximally up the esophagus. Biopsies returned as only reflux changes, he did not have EoE per pathologic criteria.  We gave him some omeprazole 40mg  to take. Since that time he reports no further issues of dysphagia at all. He is swallowing well. No chest pains. No pyrosis. Occasional belching / regurgitation. He is not taking omeprazole daily, only PRN, which seems to do a good job. Overall very happy with the regimen.  Otherwise had a colonoscopy with Dr. Saralyn Pilar at [facility] in 01/2015, had one TA removed, told to follow up in 5 years.   EGD 07/23/2016 - overt changes concerning for EoE versus reflux, empirically dilated to 39mm, mild duodenitis, biopsies showed no evidence of EoE, showed reflux changes only   Past Medical History:  Diagnosis Date  . Adenomatous colon polyp 01/23/2015  . Atopic dermatitis   . Condyloma acuminata 1980s  . Elevated blood pressure reading without diagnosis of hypertension   . History of depression    situational; on meds briefly but stopped them and it spontaneously resolved.  Marland Kitchen History of elevated glucose 2011  . House dust mite allergy   . Obesity   . OSA on CPAP dx'd 2013   5-12 cm H2O as of 06/26/11 (Dr. Michela Pitcher in [place] )     Past Surgical History:  Procedure Laterality Date  .  COLONOSCOPY    . FEMUR SURGERY  1984   Plate inserted for femur fx sustained playing football in HS; plate was later removed.  No residual problems.   Family History  Problem Relation Age of Onset  . Diabetes Father   . Stomach cancer Father        in remission  . Cancer Maternal Grandfather        lung  . Heart disease Paternal Grandfather        sudden death age 58  . Hyperlipidemia Paternal Grandfather   . Hypertension Paternal Grandfather   . Dementia Mother   . Ulcerative colitis Sister   . Cancer Paternal Grandmother        ? type  . Colon cancer Neg Hx   . Esophageal cancer Neg Hx    Social History   Tobacco Use  . Smoking status: Never Smoker  . Smokeless tobacco: Current User    Types: Chew, Snuff  Substance Use Topics  . Alcohol use: Yes    Alcohol/week: 3.0 - 5.0 standard drinks    Types: 3 - 5 Cans of beer per week  . Drug use: No   Current Outpatient Medications  Medication Sig Dispense Refill  . Multiple Vitamin (MULTIVITAMIN) tablet Take 1 tablet by mouth daily.    Marland Kitchen omeprazole (PRILOSEC) 40 MG capsule TAKE 1 CAPSULE BY MOUTH EVERY DAY 30 capsule 0  . Probiotic Product (PROBIOTIC PO) Take by mouth  every other day.     No current facility-administered medications for this visit.    No Known Allergies   Review of Systems: All systems reviewed and negative except where noted in HPI.   Physical Exam: Ht 6\' 2"  (1.88 m)   Wt 250 lb (113.4 kg)   BMI 32.10 kg/m     ASSESSMENT AND PLAN: 53 y/o male here for reassessment of the following issues:  Dysphagia / GERD - as above, initial EGD concerning for possible EoE however biopsies argued this was just reflux changes only. Following empiric dilation and starting omeprazole 40mg  his symptoms of dysphagia have completely resolved. Now using omeprazole PRN for reflux symptoms but generally doing quite well. Moving forward I would continue the omeprazole PRN if that is working for him. I discussed risks /  benefits of chronic PPI use, if using PRN risks are quite low. If he notes the symptoms are persisting despite therapy, he should contact me for reassessment. He agreed. Omeprazole refilled  [place] Cellar, MD [facility]

## 2019-07-07 HISTORY — PX: HERNIA REPAIR: SHX51

## 2019-10-30 ENCOUNTER — Other Ambulatory Visit: Payer: Self-pay | Admitting: Gastroenterology

## 2020-01-05 ENCOUNTER — Emergency Department (HOSPITAL_COMMUNITY)
Admission: EM | Admit: 2020-01-05 | Discharge: 2020-01-05 | Disposition: A | Discharge status: Home / Self Care | Payer: Managed Care, Other (non HMO) | Attending: Emergency Medicine | Admitting: Emergency Medicine

## 2020-01-05 ENCOUNTER — Encounter (HOSPITAL_COMMUNITY): Payer: Self-pay | Admitting: Emergency Medicine

## 2020-01-05 ENCOUNTER — Other Ambulatory Visit: Payer: Self-pay

## 2020-01-05 DIAGNOSIS — F10129 Alcohol abuse with intoxication, unspecified: Secondary | ICD-10-CM | POA: Insufficient documentation

## 2020-01-05 DIAGNOSIS — Z5321 Procedure and treatment not carried out due to patient leaving prior to being seen by health care provider: Secondary | ICD-10-CM | POA: Diagnosis not present

## 2020-01-05 NOTE — ED Notes (Signed)
Pt called x3 for VS with no response.
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
## 2020-01-10 ENCOUNTER — Other Ambulatory Visit: Payer: Self-pay | Admitting: Gastroenterology

## 2020-01-12 ENCOUNTER — Other Ambulatory Visit: Payer: Self-pay | Admitting: Gastroenterology

## 2020-02-01 ENCOUNTER — Other Ambulatory Visit: Payer: Self-pay | Admitting: Gastroenterology

## 2020-02-16 ENCOUNTER — Encounter: Payer: Self-pay | Admitting: Nurse Practitioner

## 2020-02-16 ENCOUNTER — Ambulatory Visit (INDEPENDENT_AMBULATORY_CARE_PROVIDER_SITE_OTHER): Payer: Managed Care, Other (non HMO) | Admitting: Nurse Practitioner

## 2020-02-16 VITALS — BP 122/80 | HR 93 | Ht 74.0 in | Wt 260.0 lb

## 2020-02-16 DIAGNOSIS — K219 Gastro-esophageal reflux disease without esophagitis: Secondary | ICD-10-CM

## 2020-02-16 DIAGNOSIS — R1310 Dysphagia, unspecified: Secondary | ICD-10-CM | POA: Diagnosis not present

## 2020-02-16 DIAGNOSIS — Z86010 Personal history of colonic polyps: Secondary | ICD-10-CM | POA: Diagnosis not present

## 2020-02-16 MED ORDER — OMEPRAZOLE 40 MG PO CPDR: DELAYED_RELEASE_CAPSULE | ORAL | 3 refills | Status: AC

## 2020-02-16 NOTE — Progress Notes (Signed)
02/16/2020 William Guerrero. [place] [id number] June 18, 1965   Chief Complaint: Omeprazole refill   History of Present Illness: William Guerrero. William Guerrero is a 54 year old male with a past medical history of obesity, depression, hypertension, sleep apnea and dysphagia with episodes of food bolus impactions. He underwent a tele visit by Dr. Havery Moros on 10/01/2018, at that time he was not having any dysphagia while taking Omeprazole 40mg  po bid then PRN.  His most recent EGD was 07/23/2016 which showed mucosal changes suggestive of eosinophilic esophagitis, the esophagus was dilated to 18 mm without any identifiable stricture noted.  The stomach was normal.  Biopsies were negative for eosinophilic esophagitis, mild chronic inflammation to the mid and distal esophagus were noted.  Peptic duodenitis without evidence of celiac disease. Father with history of stomach cancer. Sister with ulcerative colitis.  He presents to our office today for his annual follow-up.  He remains on Omeprazole 40 mg once daily without any dysphagia or heartburn.  No upper or lower abdominal pain.  He is passing a normal formed brown bowel movement daily.  His most recent colonoscopy was completed by Dr. Saralyn Pilar at [facility] 01/2015 and one tubular adenomatous polyp was removed from the transverse colon.  He was advised to repeat a colonoscopy in 5 years.  He underwent umbilical hernia surgery 07/2019.  No other complaints at this time.   CBC Latest Ref Rng & Units 04/25/2014 06/17/2011  WBC 4.0 - 10.5 K/uL 7.9 7.8  Hemoglobin 13.0 - 17.0 g/dL 15.7 14.7  Hematocrit 39 - 52 % 46.6 44.1  Platelets 150 - 400 K/uL 314.0 288.0   CMP Latest Ref Rng & Units 04/25/2014 10/14/2012 06/17/2011  Glucose 70 - 99 mg/dL 112(H) 102(H) 98  BUN 6 - 23 mg/dL 18 15 18   Creatinine 0.40 - 1.50 mg/dL 0.99 1.0 1.1  Sodium 135 - 145 mEq/L 138 136 138  Potassium 3.5 - 5.1 mEq/L 4.3 3.8 4.0  Chloride 96 - 112 mEq/L 105 104 104  CO2 19 - 32 mEq/L 30 34(H) 27    Calcium 8.4 - 10.5 mg/dL 9.0 8.7 8.8  Total Protein 6.0 - 8.3 g/dL 6.7 6.8 7.2  Total Bilirubin 0.2 - 1.2 mg/dL 0.8 0.6 0.6  Alkaline Phos 39 - 117 U/L 84 69 62  AST 0 - 37 U/L 16 17 19   ALT 0 - 53 U/L 19 23 27     Current Outpatient Medications on File Prior to Visit  Medication Sig Dispense Refill  . lisinopril (ZESTRIL) 10 MG tablet Take 1 tablet by mouth daily.    . Multiple Vitamin (MULTIVITAMIN) tablet Take 1 tablet by mouth daily.    . Probiotic Product (PROBIOTIC PO) Take by mouth every other day.     No current facility-administered medications on file prior to visit.   No Known Allergies   Current Medications, Allergies, Past Medical History, Past Surgical History, Family History and Social History were reviewed in [employer] record.   Review of Systems:   Constitutional: Negative for fever, sweats, chills or weight loss.  Respiratory: Negative for shortness of breath.   Cardiovascular: Negative for chest pain, palpitations and leg swelling.  Gastrointestinal: See HPI.  Musculoskeletal: Negative for back pain or muscle aches.  Neurological: Negative for dizziness, headaches or paresthesias.    Physical Exam: BP 122/80   Pulse 93   Ht 6\' 2"  (1.88 m)   Wt 260 lb (117.9 kg)   BMI 33.38 kg/m   General: Well  developed 54 year old male in no acute distress. Head: Normocephalic and atraumatic. Eyes: No scleral icterus. Conjunctiva pink . Ears: Normal auditory acuity. Mouth: Dentition intact. No ulcers or lesions.  Lungs: Clear throughout to auscultation. Heart: Regular rate and rhythm, no murmur. Abdomen: Soft, nontender and nondistended. No masses or hepatomegaly. Normal bowel sounds x 4 quadrants.  Scar below the umbilicus intact. Rectal: Deferred.  Musculoskeletal: Symmetrical with no gross deformities. Extremities: No edema. Neurological: Alert oriented x 4. No focal deficits.  Psychological: Alert and cooperative. Normal mood and  affect  Assessment and Recommendations:  19. 54 year old male with GERD/past dysphagia s/p empiric esophageal dilatation 07/2016 -Continue Omeprazole 40 mg once daily -Patient to call our office if his reflux or dysphagia symptoms recur  2. History of colon polyps -Colonoscopy benefits and risks discussed including risk with sedation, risk of bleeding, perforation and infection  -Patient stated he would call the office when he had time to select a colonoscopy date

## 2020-02-16 NOTE — Patient Instructions (Addendum)
We have sent the following medications to your pharmacy for you to pick up at your convenience: omeprazole.  Call our office back when you are ready to schedule the colonoscopy.   We will get your colon report from [facility].

## 2020-02-17 DIAGNOSIS — Z86010 Personal history of colonic polyps: Secondary | ICD-10-CM | POA: Insufficient documentation

## 2020-02-17 DIAGNOSIS — K219 Gastro-esophageal reflux disease without esophagitis: Secondary | ICD-10-CM | POA: Insufficient documentation

## 2020-02-17 NOTE — Progress Notes (Signed)
Agree with assessment and plan as outlined.  

## 2020-05-19 ENCOUNTER — Encounter: Payer: Self-pay | Admitting: Gastroenterology

## 2020-05-21 ENCOUNTER — Encounter: Payer: Self-pay | Admitting: Gastroenterology

## 2020-12-17 ENCOUNTER — Encounter: Payer: Self-pay | Admitting: Gastroenterology

## 2021-01-29 ENCOUNTER — Encounter: Payer: Self-pay | Admitting: Gastroenterology

## 2021-01-29 ENCOUNTER — Other Ambulatory Visit: Payer: Self-pay

## 2021-01-29 ENCOUNTER — Ambulatory Visit (AMBULATORY_SURGERY_CENTER): Payer: Managed Care, Other (non HMO) | Admitting: *Deleted

## 2021-01-29 VITALS — Ht 74.0 in | Wt 255.0 lb

## 2021-01-29 DIAGNOSIS — Z86010 Personal history of colonic polyps: Secondary | ICD-10-CM

## 2021-01-29 MED ORDER — NA SULFATE-K SULFATE-MG SULF 17.5-3.13-1.6 GM/177ML PO SOLN: 1.0000 | Freq: Once | ORAL | 0 refills | Status: AC

## 2021-01-29 NOTE — Progress Notes (Signed)
No egg or soy allergy known to patient  No issues known to pt with past sedation with any surgeries or procedures Patient denies ever being told they had issues or difficulty with intubation  No FH of Malignant Hyperthermia Pt is not on diet pills Pt is not on  home 02  Pt is not on blood thinners  Pt denies issues with constipation  No A fib or A flutter  Pt is fully vaccinated  for Covid   NO PA's for preps discussed with pt In PV today  Discussed with pt there will be an out-of-pocket cost for prep and that varies from $0 to 70 +  dollars - pt verbalized understanding   Due to the COVID-19 pandemic we are asking patients to follow certain guidelines in PV and the [place]   Pt aware of COVID protocols and [facility] guidelines   Pt verified name, DOB, address and insurance during PV today.  Pt mailed instruction packet of Emmi video, copy of consent form to read and not return, and instructions. PV completed over the phone.  Pt encouraged to call with questions or issues.  My Chart instructions to pt as well

## 2021-02-12 ENCOUNTER — Encounter: Payer: Self-pay | Admitting: Gastroenterology

## 2021-02-12 ENCOUNTER — Ambulatory Visit (AMBULATORY_SURGERY_CENTER): Payer: Managed Care, Other (non HMO) | Admitting: Gastroenterology

## 2021-02-12 VITALS — BP 132/78 | HR 74 | Temp 98.7°F | Resp 11 | Ht 74.0 in | Wt 260.0 lb

## 2021-02-12 DIAGNOSIS — K635 Polyp of colon: Secondary | ICD-10-CM | POA: Diagnosis not present

## 2021-02-12 DIAGNOSIS — D175 Benign lipomatous neoplasm of intra-abdominal organs: Secondary | ICD-10-CM | POA: Diagnosis not present

## 2021-02-12 DIAGNOSIS — D120 Benign neoplasm of cecum: Secondary | ICD-10-CM

## 2021-02-12 DIAGNOSIS — D127 Benign neoplasm of rectosigmoid junction: Secondary | ICD-10-CM

## 2021-02-12 DIAGNOSIS — Z86010 Personal history of colonic polyps: Secondary | ICD-10-CM

## 2021-02-12 DIAGNOSIS — D122 Benign neoplasm of ascending colon: Secondary | ICD-10-CM

## 2021-02-12 MED ORDER — SODIUM CHLORIDE 0.9 % IV SOLN: 500.0000 mL | Freq: Once | INTRAVENOUS | Status: DC

## 2021-02-12 NOTE — Op Note (Signed)
[place] Patient Name: William Guerrero Procedure Date: 02/12/2021 11:56 AM MRN: [id number] Endoscopist: Remo Lipps P. Havery Moros , MD Age: 55 Referring MD:  Date of Birth: 1966-03-04 Gender: Male Account #: [id number] Procedure:                Colonoscopy Indications:              High risk colon cancer surveillance: Personal                            history of colonic polyps - 01/2015 [facility] -                            adenoma Medicines:                Monitored Anesthesia Care Procedure:                Pre-Anesthesia Assessment:                           - Prior to the procedure, a History and Physical                            was performed, and patient medications and                            allergies were reviewed. The patient's tolerance of                            previous anesthesia was also reviewed. The risks                            and benefits of the procedure and the sedation                            options and risks were discussed with the patient.                            All questions were answered, and informed consent                            was obtained. Prior Anticoagulants: The patient has                            taken no previous anticoagulant or antiplatelet                            agents. ASA Grade Assessment: II - A patient with                            mild systemic disease. After reviewing the risks                            and benefits, the patient was deemed in  satisfactory condition to undergo the procedure.                           After obtaining informed consent, the colonoscope                            was passed under direct vision. Throughout the                            procedure, the patient's blood pressure, pulse, and                            oxygen saturations were monitored continuously. The                            CF [id number] #[id number] was introduced through the  anus                            and advanced to the the cecum, identified by                            appendiceal orifice and ileocecal valve. The                            colonoscopy was performed without difficulty. The                            patient tolerated the procedure well. The quality                            of the bowel preparation was good. The ileocecal                            valve, appendiceal orifice, and rectum were                            photographed. Scope In: 12:00:57 PM Scope Out: 12:15:56 PM Scope Withdrawal Time: 0 hours 12 minutes 59 seconds  Total Procedure Duration: 0 hours 14 minutes 59 seconds  Findings:                 The perianal and digital rectal examinations were                            normal.                           A 3 mm polyp was found in the cecum. The polyp was                            flat. The polyp was removed with a cold snare.                            Resection and retrieval were complete.  A 3 to 4 mm polyp was found in the ascending colon.                            The polyp was flat. The polyp was removed with a                            cold snare. Resection and retrieval were complete.                           A 3 mm polyp was found in the recto-sigmoid colon.                            The polyp was sessile. The polyp was removed with a                            cold snare. Resection and retrieval were complete.                           Internal hemorrhoids were found during                            retroflexion. The hemorrhoids were small.                           The exam was otherwise without abnormality. Complications:            No immediate complications. Estimated blood loss:                            Minimal. Estimated Blood Loss:     Estimated blood loss was minimal. Impression:               - One 3 mm polyp in the cecum, removed with a cold                             snare. Resected and retrieved.                           - One 3 to 4 mm polyp in the ascending colon,                            removed with a cold snare. Resected and retrieved.                           - One 3 mm polyp at the recto-sigmoid colon,                            removed with a cold snare. Resected and retrieved.                           - Internal hemorrhoids.                           -  The examination was otherwise normal. Recommendation:           - Patient has a contact number available for                            emergencies. The signs and symptoms of potential                            delayed complications were discussed with the                            patient. Return to normal activities tomorrow.                            Written discharge instructions were provided to the                            patient.                           - Resume previous diet.                           - Continue present medications.                           - Await pathology results. Remo Lipps P. Tilton Marsalis, MD 02/12/2021 12:20:25 PM This report has been signed electronically.

## 2021-02-12 NOTE — Progress Notes (Signed)
To PACU, VSS. Report to [email or web address] 

## 2021-02-12 NOTE — Progress Notes (Signed)
Called to room to assist during endoscopic procedure.  Patient ID and intended procedure confirmed with present staff. Received instructions for my participation in the procedure from the performing physician.  

## 2021-02-12 NOTE — Progress Notes (Signed)
[facility] History and Physical   Primary Care Physician:  Pcp, No   Reason for Procedure:   History of colon polyps  Plan:    colonoscopy     HPI: William Guerrero is a 55 y.o. male  here for colonoscopy surveillance - history of adenomas removed at [facility] on 01/2015. Patient denies any bowel symptoms at this time. No family history of colon cancer known. Otherwise feels well without any cardiopulmonary symptoms.    Past Medical History:  Diagnosis Date   Adenomatous colon polyp 01/23/2015   Atopic dermatitis    Condyloma acuminata 1980s   Elevated blood pressure reading without diagnosis of hypertension    GERD (gastroesophageal reflux disease)    History of depression    situational; on meds briefly but stopped them and it spontaneously resolved.   History of elevated glucose 2011   House dust mite allergy    Obesity    OSA on CPAP dx'd 2013   5-12 cm H2O as of 06/26/11 (Dr. Michela Pitcher in [place] )   Pre-diabetes    on Metformin--  5.9 A1C   Sleep apnea     Past Surgical History:  Procedure Laterality Date   COLONOSCOPY     FEMUR SURGERY  1984   Plate inserted for femur fx sustained playing football in HS; plate was later removed.  No residual problems.   HERNIA REPAIR  07/2019   POLYPECTOMY     UPPER GASTROINTESTINAL ENDOSCOPY  07/23/2016    Prior to Admission medications   Medication Sig Start Date End Date Taking? Authorizing Provider  atorvastatin (LIPITOR) 10 MG tablet Take 10 mg by mouth daily. 10/30/20  Yes [provider]  lisinopril (ZESTRIL) 10 MG tablet Take 1 tablet by mouth daily. 01/04/20  Yes [provider]  metFORMIN (GLUCOPHAGE) 500 MG tablet Take 500 mg by mouth every morning. 10/30/20  Yes [provider]  Multiple Vitamin (MULTIVITAMIN) tablet Take 1 tablet by mouth daily.   Yes [provider]  Na Sulfate-K Sulfate-Mg Sulf 17.5-3.13-1.6 GM/177ML SOLN Take by mouth. 01/30/21  Yes [provider]  omeprazole (PRILOSEC) 40 MG capsule TAKE 1 CAPSULE BY MOUTH EVERY DAY 02/16/20  Yes Noralyn Pick, NP  Probiotic Product (PROBIOTIC PO) Take by mouth every other day. Patient not taking: No sig reported    [provider]    Current Outpatient Medications  Medication Sig Dispense Refill   atorvastatin (LIPITOR) 10 MG tablet Take 10 mg by mouth daily.     lisinopril (ZESTRIL) 10 MG tablet Take 1 tablet by mouth daily.     metFORMIN (GLUCOPHAGE) 500 MG tablet Take 500 mg by mouth every morning.     Multiple Vitamin (MULTIVITAMIN) tablet Take 1 tablet by mouth daily.     Na Sulfate-K Sulfate-Mg Sulf 17.5-3.13-1.6 GM/177ML SOLN Take by mouth.     omeprazole (PRILOSEC) 40 MG capsule TAKE 1 CAPSULE BY MOUTH EVERY DAY 90 capsule 3   Probiotic Product (PROBIOTIC PO) Take by mouth every other day. (Patient not taking: No sig reported)     Current Facility-Administered Medications  Medication Dose Route Frequency Provider Last Rate Last Admin   0.9 %  sodium chloride infusion  500 mL Intravenous Once Zadiel Leyh, Carlota Raspberry, MD        Allergies as of 02/12/2021   (No Known Allergies)    Family History  Problem Relation Age of Onset   Dementia Mother    Diabetes Father    Stomach cancer  Father        in remission   Ulcerative colitis Sister    Cancer Maternal Grandfather        lung   Cancer Paternal Grandmother        ? type   Heart disease Paternal Grandfather        sudden death age 48   Hyperlipidemia Paternal Grandfather    Hypertension Paternal Grandfather    Colon cancer Neg Hx    Esophageal cancer Neg Hx    Colon polyps Neg Hx    Rectal cancer Neg Hx     Social History   Socioeconomic History   Marital status: Married    Spouse name: Not on file   Number of children: 2   Years of education: Not on file   Highest education level: Not on file  Occupational History   Occupation: car salesman  Tobacco Use   Smoking status: Never    Smokeless tobacco: Current    Types: Chew, Snuff  Vaping Use   Vaping Use: Never used  Substance and Sexual Activity   Alcohol use: Yes    Alcohol/week: 3.0 - 5.0 standard drinks    Types: 3 - 5 Cans of beer per week   Drug use: No   Sexual activity: Not on file  Other Topics Concern   Not on file  Social History Narrative   Married, 2 children.   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   Education: 3 yrs college.   No T/A/Ds.   Enjoys spending time with his kids.   No exercise currently but he is not a sedentary person.   Social Determinants of Health   Financial Resource Strain: Not on file  Food Insecurity: Not on file  Transportation Needs: Not on file  Physical Activity: Not on file  Stress: Not on file  Social Connections: Not on file  Intimate Partner Violence: Not on file    Review of Systems: All other review of systems negative except as mentioned in the HPI.  Physical Exam: Vital signs BP 126/76   Pulse 84   Temp 98.7 F (37.1 C) (Temporal)   Ht 6\' 2"  (1.88 m)   Wt 260 lb (117.9 kg)   SpO2 97%   BMI 33.38 kg/m   General:   Alert,  Well-developed, pleasant and cooperative in NAD Lungs:  Clear throughout to auscultation.   Heart:  Regular rate and rhythm Abdomen:  Soft, nontender and nondistended.   Neuro/Psych:  Alert and cooperative. Normal mood and affect. A and O x 3  Jolly Mango, MD [facility]

## 2021-02-12 NOTE — Progress Notes (Signed)
Pt's states no medical or surgical changes since previsit or office visit.  Vitals DT 

## 2021-02-12 NOTE — Patient Instructions (Signed)
Impression/Recommendations:  Polyp and hemorrhoid handouts given to patient.  Resume previous diet. Continue present medications. Await pathology results.  YOU HAD AN ENDOSCOPIC PROCEDURE TODAY AT [facility]:   Refer to the procedure report that was given to you for any specific questions about what was found during the examination.  If the procedure report does not answer your questions, please call your gastroenterologist to clarify.  If you requested that your care partner not be given the details of your procedure findings, then the procedure report has been included in a sealed envelope for you to review at your convenience later.  YOU SHOULD EXPECT: Some feelings of bloating in the abdomen. Passage of more gas than usual.  Walking can help get rid of the air that was put into your GI tract during the procedure and reduce the bloating. If you had a lower endoscopy (such as a colonoscopy or flexible sigmoidoscopy) you may notice spotting of blood in your stool or on the toilet paper. If you underwent a bowel prep for your procedure, you may not have a normal bowel movement for a few days.  Please Note:  You might notice some irritation and congestion in your nose or some drainage.  This is from the oxygen used during your procedure.  There is no need for concern and it should clear up in a day or so.  SYMPTOMS TO REPORT IMMEDIATELY:  Following lower endoscopy (colonoscopy or flexible sigmoidoscopy):  Excessive amounts of blood in the stool  Significant tenderness or worsening of abdominal pains  Swelling of the abdomen that is new, acute  Fever of 100F or higher  For urgent or emergent issues, a gastroenterologist can be reached at any hour by calling [phone]. Do not use MyChart messaging for urgent concerns.    DIET:  We do recommend a small meal at first, but then you may proceed to your regular diet.  Drink plenty of fluids but you should avoid alcoholic  beverages for 24 hours.  ACTIVITY:  You should plan to take it easy for the rest of today and you should NOT DRIVE or use heavy machinery until tomorrow (because of the sedation medicines used during the test).    FOLLOW UP: Our staff will call the number listed on your records 48-72 hours following your procedure to check on you and address any questions or concerns that you may have regarding the information given to you following your procedure. If we do not reach you, we will leave a message.  We will attempt to reach you two times.  During this call, we will ask if you have developed any symptoms of COVID 19. If you develop any symptoms (ie: fever, flu-like symptoms, shortness of breath, cough etc.) before then, please call [phone].  If you test positive for Covid 19 in the 2 weeks post procedure, please call and report this information to [place].    If any biopsies were taken you will be contacted by phone or by letter within the next 1-3 weeks.  Please call us at [phone] if you have not heard about the biopsies in 3 weeks.    SIGNATURES/CONFIDENTIALITY: You and/or your care partner have signed paperwork which will be entered into your electronic medical record.  These signatures attest to the fact that that the information above on your After Visit Summary has been reviewed and is understood.  Full responsibility of the confidentiality of this discharge information lies with you and/or your care-partner.

## 2021-02-14 ENCOUNTER — Telehealth: Payer: Self-pay | Admitting: *Deleted

## 2021-02-14 NOTE — Telephone Encounter (Signed)
  Follow up Call-  Call back number 02/12/2021  Post procedure Call Back phone  # [phone]  Permission to leave phone message Yes  Some recent data might be hidden     Patient questions:  Do you have a fever, pain , or abdominal swelling? No. Pain Score  0 *  Have you tolerated food without any problems? Yes.    Have you been able to return to your normal activities? Yes.    Do you have any questions about your discharge instructions: Diet   No. Medications  No. Follow up visit  No.  Do you have questions or concerns about your Care? No.  Actions: * If pain score is 4 or above: No action needed, pain <4.  Have you developed a fever since your procedure? no  2.   Have you had an respiratory symptoms (SOB or cough) since your procedure? no  3.   Have you tested positive for COVID 19 since your procedure no  4.   Have you had any family members/close contacts diagnosed with the COVID 19 since your procedure?  no   If yes to any of these questions please route to Joylene John, RN and Joella Prince, RN
